# Patient Record
Sex: Male | Born: 1955 | Race: White | Hispanic: No | Marital: Married | Smoking: Never smoker
Health system: Southern US, Community
[De-identification: ages and names within clinical notes are randomized; demographics above are authoritative.]

## PROBLEM LIST (undated history)

## (undated) HISTORY — PX: AORTIC VALVE REPLACEMENT (AVR)/CORONARY ARTERY BYPASS GRAFTING (CABG): SHX5725

---

## 2003-12-11 DIAGNOSIS — I359 Nonrheumatic aortic valve disorder, unspecified: Secondary | ICD-10-CM | POA: Insufficient documentation

## 2004-02-16 DIAGNOSIS — M545 Low back pain: Secondary | ICD-10-CM | POA: Insufficient documentation

## 2012-05-07 DIAGNOSIS — Z7901 Long term (current) use of anticoagulants: Secondary | ICD-10-CM | POA: Insufficient documentation

## 2012-12-09 DIAGNOSIS — Z2821 Immunization not carried out because of patient refusal: Secondary | ICD-10-CM | POA: Insufficient documentation

## 2013-01-22 DIAGNOSIS — G8929 Other chronic pain: Secondary | ICD-10-CM | POA: Insufficient documentation

## 2014-10-14 DIAGNOSIS — H16139 Photokeratitis, unspecified eye: Secondary | ICD-10-CM | POA: Insufficient documentation

## 2015-08-16 DIAGNOSIS — H6980 Other specified disorders of Eustachian tube, unspecified ear: Secondary | ICD-10-CM | POA: Insufficient documentation

## 2016-01-09 ENCOUNTER — Emergency Department: Payer: BC Managed Care – PPO

## 2016-01-09 ENCOUNTER — Inpatient Hospital Stay
Admission: EM | Admit: 2016-01-09 | Discharge: 2016-01-14 | DRG: 694 | Disposition: A | Discharge status: Home / Self Care | Payer: BC Managed Care – PPO | Attending: Specialist | Admitting: Specialist

## 2016-01-09 DIAGNOSIS — R319 Hematuria, unspecified: Secondary | ICD-10-CM | POA: Diagnosis present

## 2016-01-09 DIAGNOSIS — R1030 Lower abdominal pain, unspecified: Secondary | ICD-10-CM

## 2016-01-09 DIAGNOSIS — Z87442 Personal history of urinary calculi: Secondary | ICD-10-CM

## 2016-01-09 DIAGNOSIS — Z952 Presence of prosthetic heart valve: Secondary | ICD-10-CM

## 2016-01-09 DIAGNOSIS — N132 Hydronephrosis with renal and ureteral calculous obstruction: Principal | ICD-10-CM | POA: Diagnosis present

## 2016-01-09 DIAGNOSIS — N179 Acute kidney failure, unspecified: Secondary | ICD-10-CM | POA: Diagnosis present

## 2016-01-09 DIAGNOSIS — F17290 Nicotine dependence, other tobacco product, uncomplicated: Secondary | ICD-10-CM | POA: Diagnosis present

## 2016-01-09 DIAGNOSIS — Z951 Presence of aortocoronary bypass graft: Secondary | ICD-10-CM

## 2016-01-09 DIAGNOSIS — R310 Gross hematuria: Secondary | ICD-10-CM | POA: Diagnosis present

## 2016-01-09 DIAGNOSIS — D72829 Elevated white blood cell count, unspecified: Secondary | ICD-10-CM | POA: Diagnosis present

## 2016-01-09 DIAGNOSIS — Z7982 Long term (current) use of aspirin: Secondary | ICD-10-CM

## 2016-01-09 DIAGNOSIS — Z7901 Long term (current) use of anticoagulants: Secondary | ICD-10-CM

## 2016-01-09 DIAGNOSIS — N3289 Other specified disorders of bladder: Secondary | ICD-10-CM | POA: Diagnosis present

## 2016-01-09 DIAGNOSIS — K5750 Diverticulosis of both small and large intestine without perforation or abscess without bleeding: Secondary | ICD-10-CM | POA: Diagnosis present

## 2016-01-09 LAB — CBC WITH DIFFERENTIAL/PLATELET
BASOS ABS: 0 10*3/uL (ref 0–0.1)
Basophils Relative: 0 %
Eosinophils Absolute: 0.1 10*3/uL (ref 0–0.7)
Eosinophils Relative: 1 %
HEMATOCRIT: 42.4 % (ref 40.0–52.0)
HEMOGLOBIN: 14.6 g/dL (ref 13.0–18.0)
LYMPHS ABS: 1.8 10*3/uL (ref 1.0–3.6)
LYMPHS PCT: 11 %
MCH: 32.8 pg (ref 26.0–34.0)
MCHC: 34.4 g/dL (ref 32.0–36.0)
MCV: 95.2 fL (ref 80.0–100.0)
Monocytes Absolute: 1.7 10*3/uL — ABNORMAL HIGH (ref 0.2–1.0)
Monocytes Relative: 10 %
NEUTROS ABS: 12.7 10*3/uL — AB (ref 1.4–6.5)
Neutrophils Relative %: 78 %
Platelets: 217 10*3/uL (ref 150–440)
RBC: 4.45 MIL/uL (ref 4.40–5.90)
RDW: 13.1 % (ref 11.5–14.5)
WBC: 16.3 10*3/uL — AB (ref 3.8–10.6)

## 2016-01-09 LAB — COMPREHENSIVE METABOLIC PANEL
ALK PHOS: 79 U/L (ref 38–126)
ALT: 17 U/L (ref 17–63)
AST: 35 U/L (ref 15–41)
Albumin: 4.5 g/dL (ref 3.5–5.0)
Anion gap: 10 (ref 5–15)
BILIRUBIN TOTAL: 0.5 mg/dL (ref 0.3–1.2)
BUN: 11 mg/dL (ref 6–20)
CALCIUM: 8.9 mg/dL (ref 8.9–10.3)
CHLORIDE: 103 mmol/L (ref 101–111)
CO2: 21 mmol/L — ABNORMAL LOW (ref 22–32)
CREATININE: 0.85 mg/dL (ref 0.61–1.24)
Glucose, Bld: 136 mg/dL — ABNORMAL HIGH (ref 65–99)
Potassium: 3.6 mmol/L (ref 3.5–5.1)
Sodium: 134 mmol/L — ABNORMAL LOW (ref 135–145)
TOTAL PROTEIN: 7.6 g/dL (ref 6.5–8.1)

## 2016-01-09 LAB — PROTIME-INR
INR: 2.97
Prothrombin Time: 31.5 seconds — ABNORMAL HIGH (ref 11.4–15.2)

## 2016-01-09 MED ORDER — HYDROMORPHONE HCL 1 MG/ML IJ SOLN
1.0000 mg | Freq: Once | INTRAMUSCULAR | Status: AC
  Administered 2016-01-09: 1 mg via INTRAVENOUS

## 2016-01-09 MED ORDER — HYDROMORPHONE HCL 1 MG/ML IJ SOLN
INTRAMUSCULAR | Status: AC
  Administered 2016-01-09: 1 mg via INTRAVENOUS
  Filled 2016-01-09: qty 1

## 2016-01-09 MED ORDER — IOPAMIDOL (ISOVUE-300) INJECTION 61%
125.0000 mL | Freq: Once | INTRAVENOUS | Status: AC | PRN
  Administered 2016-01-09: 125 mL via INTRAVENOUS

## 2016-01-09 MED ORDER — PROMETHAZINE HCL 25 MG/ML IJ SOLN
12.5000 mg | Freq: Once | INTRAMUSCULAR | Status: AC
  Administered 2016-01-09: 12.5 mg via INTRAVENOUS
  Filled 2016-01-09: qty 1

## 2016-01-09 MED ORDER — MORPHINE SULFATE (PF) 4 MG/ML IV SOLN
4.0000 mg | INTRAVENOUS | Status: DC | PRN
  Administered 2016-01-09 – 2016-01-10 (×4): 4 mg via INTRAVENOUS
  Filled 2016-01-09 (×4): qty 1

## 2016-01-09 MED ORDER — SODIUM CHLORIDE 0.9 % IV BOLUS (SEPSIS)
1000.0000 mL | Freq: Once | INTRAVENOUS | Status: AC
  Administered 2016-01-09: 1000 mL via INTRAVENOUS

## 2016-01-09 NOTE — ED Provider Notes (Signed)
[facility] Emergency Department Provider Note  ____________________________________________   First MD Initiated Contact with Patient 01/09/16 2313     (approximate)  I have reviewed the triage vital signs and the nursing notes.   HISTORY  Chief Complaint Coagulation Disorder   HPI Tim Wright is a 60 y.o. male with a history of kidney stones as well as an aortic valve replacement on Coumadin who is presenting to the emergency department with lower abdominal pain radiating to the right side of his back as well as hematuria since 5 PM today. He says the pain is sharp and severe. He says that he was having clots from his penis prior to arrival.he does not report any nausea or vomiting.   History reviewed. No pertinent past medical history.  There are no active problems to display for this patient.   No past surgical history on file.  Prior to Admission medications   Medication Sig Start Date End Date Taking? Authorizing Provider  HYDROcodone-acetaminophen (NORCO/VICODIN) 5-325 MG tablet Take 1 tablet by mouth every 6 (six) hours as needed for moderate pain. 12/15/15   Historical Provider, MD  warfarin (COUMADIN) 2 MG tablet Take 1-2 tablets by mouth as directed. Take 2 tablets (4 mg) on Monday and Friday. Take 1 tablet (2 mg) on Sunday, Tuesday, Thursday, and Saturday. 12/20/15   Historical Provider, MD    Allergies Patient has no allergy information on record.  No family history on file.  Social History Social History  Substance Use Topics  . Smoking status: Not on file  . Smokeless tobacco: Not on file  . Alcohol use Not on file    Review of Systems Constitutional: No fever/chills Eyes: No visual changes. ENT: No sore throat. Cardiovascular: Denies chest pain. Respiratory: Denies shortness of breath. Gastrointestinal:   No nausea, no vomiting.  No diarrhea.  No constipation. Genitourinary: as above Musculoskeletal: as above Skin:  Negative for rash. Neurological: Negative for headaches, focal weakness or numbness.  10-point ROS otherwise negative.  ____________________________________________   PHYSICAL EXAM:  VITAL SIGNS: ED Triage Vitals  Enc Vitals Group     BP --      Pulse Rate 01/09/16 2235 90     Resp 01/09/16 2235 16     Temp --      Temp src --      SpO2 01/09/16 2235 98 %     Weight 01/09/16 2236 190 lb (86.2 kg)     Height 01/09/16 2236 6' 1.5" (1.867 m)     Head Circumference --      Peak Flow --      Pain Score 01/09/16 2329 10     Pain Loc --      Pain Edu? --      Excl. in GC? --     Constitutional: Alert and oriented. Patient visibly uncomfortable. Yelling in pain. Eyes: Conjunctivae are normal. PERRL. EOMI. Head: Atraumatic. Nose: No congestion/rhinnorhea. Mouth/Throat: Mucous membranes are moist.   Neck: No stridor.   Cardiovascular: Normal rate, regular rhythm. Grossly normal heart sounds.   Respiratory: Normal respiratory effort.  No retractions. Lungs CTAB. Gastrointestinal: Soft with diffuse tenderness palpation especially the right lower quadrant. No distention.  No CVA tenderness. Genitourinary:  Urethral Foley in place with pink bloody urine draining. Initially fully was placed and 700 cc of urine rapidly returned. Musculoskeletal: No lower extremity tenderness nor edema.  No joint effusions. Neurologic:  Normal speech and language. No gross focal neurologic deficits are appreciated.  Skin:  Skin is warm, dry and intact. No rash noted. Psychiatric: Mood and affect are normal. Speech and behavior are normal.  ____________________________________________   LABS (all labs ordered are listed, but only abnormal results are displayed)  Labs Reviewed  PROTIME-INR - Abnormal; Notable for the following:       Result Value   Prothrombin Time 31.5 (*)    All other components within normal limits  CBC WITH DIFFERENTIAL/PLATELET - Abnormal; Notable for the following:    WBC  16.3 (*)    Neutro Abs 12.7 (*)    Monocytes Absolute 1.7 (*)    All other components within normal limits  COMPREHENSIVE METABOLIC PANEL - Abnormal; Notable for the following:    Sodium 134 (*)    CO2 21 (*)    Glucose, Bld 136 (*)    All other components within normal limits  URINALYSIS, COMPLETE (UACMP) WITH MICROSCOPIC   ____________________________________________  EKG   ____________________________________________  RADIOLOGY    CT HEMATURIA WORKUP (Final result)  Result time 01/10/16 01:23:08  Final result by Awilda Metroourtnay Bloomer, MD (01/10/16 01:23:08)           Narrative:   CLINICAL DATA: Gross hematuria, severe pelvic pain. On Coumadin.  EXAM: CT ABDOMEN AND PELVIS WITHOUT AND WITH CONTRAST  TECHNIQUE: Multidetector CT imaging of the abdomen and pelvis was performed following the standard protocol before and following the bolus administration of intravenous contrast.  CONTRAST: 125mL ISOVUE-300 IOPAMIDOL (ISOVUE-300) INJECTION 61%  COMPARISON: None.  FINDINGS: LOWER CHEST: Minimal atelectasis. Ectatic versus aneurysmal included aorta with heavily calcified aortic root versus bowel, status post median sternotomy. The heart is mildly enlarged. No pericardial effusions. Mild coronary artery calcifications.  HEPATOBILIARY: Liver is normal. Multiple subcentimeter gallstones without CT findings of acute cholecystitis.  PANCREAS: Normal.  SPLEEN: Normal.  ADRENALS/URINARY TRACT: Kidneys are orthotopic, demonstrating symmetric enhancement. Intrinsically dense RIGHT urinary collecting system. 4 mm RIGHT lower pole nephrolithiasis. Mild bilateral hydroureteronephrosis. No ureteral calculi. Delayed RIGHT nephrogram. Too small to characterize hypodensities kidneys bilaterally. No mass. Urinary bladder is very distended with dependent rounded density consistent with blood products. No enhancing masses within the ureter ears or urinary bladder.  Foley catheter and retaining bulb within the bladder which is very distended.  STOMACH/BOWEL: The stomach, small and large bowel are normal in course and caliber without inflammatory changes. Severe LEFT: Diverticulosis. Small air-filled duodenum diverticulum. Normal appendix.  VASCULAR/LYMPHATIC: Aortoiliac vessels are normal in course and caliber, moderate calcific atherosclerosis. No lymphadenopathy by CT size criteria.  REPRODUCTIVE: Normal.  OTHER: No intraperitoneal free fluid or free air.  MUSCULOSKELETAL: Nonacute. Small fat containing umbilical hernias. Developmentally unfused L5 spinous process. Grade 1 L5-S1 anterolisthesis, chronic RIGHT L5 pars interarticularis defect.  IMPRESSION: Dense RIGHT collecting system compatible with hemorrhage. Hematoma within distended urinary bladder, containing Foley catheter.  RIGHT renal dysfunction. Mild bilateral hydroureteronephrosis. 4 mm RIGHT lower pole nephrolithiasis.  Severe LEFT colon diverticulosis without diverticulitis.   Electronically Signed By: Awilda Metroourtnay Bloomer M.D. On: 01/10/2016 01:23          ____________________________________________   PROCEDURES  Procedure(s) performed:   Procedures  Critical Care performed:   ____________________________________________   INITIAL IMPRESSION / ASSESSMENT AND PLAN / ED COURSE  Pertinent labs & imaging results that were available during my care of the patient were reviewed by me and considered in my medical decision making (see chart for details).   Clinical Course   ----------------------------------------- 3:35 AM on 01/10/2016 -----------------------------------------  Patient with persistent abdominal pain. Patient at  this time has received 8 mg of morphine followed by 1 mg of Dilaudid followed by 4 mg of morphine. I discussed the case with Dr. Sherryl BartersBudzyn of urology.  He reviewed the patient's CAT scan and thinks that he may see a renal mass to the  right side which could be the bleeding site. I also discussed case with Dr. Sherlie BanMcClain of cardiology. He recommends holding the patient's Coumadin but not directly reversing because of the mechanical valve. The patient has been undergoing three-way bladder irrigation and the urine is still correlated red. Because of the patient's persistent pain as well as hematuria he will be admitted to the hospital. Signed out to Dr. Sheryle Haildiamond. Patient aware of the CAT scan results. We did discuss the possibility of a right-sided renal mass. He as well as his family and also understanding and will comply with admission.  Abdominal pain likely secondary to bladder spasm versus persistent clot in the bladder.   ____________________________________________   FINAL CLINICAL IMPRESSION(S) / ED DIAGNOSES  Final diagnoses:  Hematuria  Abdominal pain.    NEW MEDICATIONS STARTED DURING THIS VISIT:  New Prescriptions   No medications on file     Note:  This document was prepared using Dragon voice recognition software and may include unintentional dictation errors.    Myrna Blazeravid Matthew Schaevitz, MD 01/10/16 [phone]

## 2016-01-09 NOTE — ED Triage Notes (Signed)
Pt presents via ems from c/o 200 cc of Blood in toilet from penis. Pt on coumadin and said to have been bleeding for 5 hours

## 2016-01-09 NOTE — ED Notes (Signed)
Pt moved to new room. Pt was given a 2nd dose of morphine after verbal order from Dr. Roxan Hockeyobinson. Pt is moaning at this time but is screaming, "I ain't getting no relief."

## 2016-01-09 NOTE — ED Notes (Signed)
Dr. Pershing ProudSchaevitz notified of pt's complaint of pain. Dr. Pershing ProudSchaevitz is at pt's bedside at this time.

## 2016-01-09 NOTE — ED Notes (Signed)
Gave report to [employer] Resourceslivia Rn

## 2016-01-09 NOTE — ED Notes (Signed)
Allyison Pate inserted Foley Catheter 16 FR

## 2016-01-10 ENCOUNTER — Encounter: Payer: Self-pay | Admitting: Internal Medicine

## 2016-01-10 DIAGNOSIS — R319 Hematuria, unspecified: Secondary | ICD-10-CM | POA: Diagnosis present

## 2016-01-10 DIAGNOSIS — R1030 Lower abdominal pain, unspecified: Secondary | ICD-10-CM

## 2016-01-10 LAB — URINALYSIS, COMPLETE (UACMP) WITH MICROSCOPIC
Bacteria, UA: NONE SEEN
Specific Gravity, Urine: 1.015 (ref 1.005–1.030)
Squamous Epithelial / LPF: NONE SEEN

## 2016-01-10 LAB — TSH: TSH: 0.882 u[IU]/mL (ref 0.350–4.500)

## 2016-01-10 MED ORDER — OXYBUTYNIN CHLORIDE 5 MG PO TABS
5.0000 mg | ORAL_TABLET | Freq: Once | ORAL | Status: AC
  Administered 2016-01-10: 5 mg via ORAL
  Filled 2016-01-10: qty 1

## 2016-01-10 MED ORDER — SODIUM CHLORIDE 0.9 % IV SOLN
INTRAVENOUS | Status: DC
  Administered 2016-01-10 – 2016-01-14 (×13): via INTRAVENOUS

## 2016-01-10 MED ORDER — OXYBUTYNIN CHLORIDE 5 MG PO TABS: 5.0000 mg | ORAL_TABLET | Freq: Three times a day (TID) | ORAL | Status: DC

## 2016-01-10 MED ORDER — HYDROMORPHONE HCL 1 MG/ML IJ SOLN: 1.0000 mg | INTRAMUSCULAR | Status: DC | PRN

## 2016-01-10 MED ORDER — HYDROCODONE-ACETAMINOPHEN 5-325 MG PO TABS
1.0000 | ORAL_TABLET | Freq: Four times a day (QID) | ORAL | Status: DC | PRN
  Administered 2016-01-10 – 2016-01-11 (×4): 1 via ORAL
  Filled 2016-01-10 (×5): qty 1

## 2016-01-10 MED ORDER — HYDROMORPHONE HCL 1 MG/ML IJ SOLN
1.0000 mg | INTRAMUSCULAR | Status: DC | PRN
  Administered 2016-01-10: 11:00:00 1 mg via INTRAVENOUS
  Filled 2016-01-10 (×2): qty 1

## 2016-01-10 MED ORDER — BELLADONNA ALKALOIDS-OPIUM 16.2-60 MG RE SUPP
1.0000 | Freq: Once | RECTAL | Status: AC
  Administered 2016-01-10: 15:00:00 1 via RECTAL

## 2016-01-10 MED ORDER — DEXTROSE 5 % IV SOLN: 1.0000 g | Freq: Once | INTRAVENOUS | Status: DC

## 2016-01-10 MED ORDER — CEFTRIAXONE SODIUM-DEXTROSE 1-3.74 GM-% IV SOLR
1.0000 g | Freq: Once | INTRAVENOUS | Status: AC
  Administered 2016-01-10: 17:00:00 1 g via INTRAVENOUS
  Filled 2016-01-10: qty 50

## 2016-01-10 MED ORDER — MORPHINE SULFATE (PF) 4 MG/ML IV SOLN
4.0000 mg | INTRAVENOUS | Status: DC | PRN
  Administered 2016-01-10 – 2016-01-12 (×6): 4 mg via INTRAVENOUS
  Filled 2016-01-10 (×6): qty 1

## 2016-01-10 MED ORDER — ONDANSETRON HCL 4 MG/2ML IJ SOLN: 4.0000 mg | Freq: Four times a day (QID) | INTRAMUSCULAR | Status: DC | PRN

## 2016-01-10 MED ORDER — OXYBUTYNIN CHLORIDE 5 MG PO TABS
10.0000 mg | ORAL_TABLET | Freq: Two times a day (BID) | ORAL | Status: DC
  Administered 2016-01-10 – 2016-01-14 (×9): 10 mg via ORAL
  Filled 2016-01-10 (×9): qty 2

## 2016-01-10 MED ORDER — TAMSULOSIN HCL 0.4 MG PO CAPS
0.4000 mg | ORAL_CAPSULE | Freq: Every day | ORAL | Status: DC
  Administered 2016-01-10 – 2016-01-14 (×5): 0.4 mg via ORAL
  Filled 2016-01-10 (×5): qty 1

## 2016-01-10 MED ORDER — HYDROMORPHONE HCL 1 MG/ML IJ SOLN
1.0000 mg | INTRAMUSCULAR | Status: DC | PRN
  Administered 2016-01-10: 1 mg via INTRAVENOUS
  Filled 2016-01-10: qty 1

## 2016-01-10 MED ORDER — ALUM & MAG HYDROXIDE-SIMETH 200-200-20 MG/5ML PO SUSP
15.0000 mL | ORAL | Status: DC | PRN
Start: 2016-01-10 — End: 2016-01-14

## 2016-01-10 MED ORDER — HYDROMORPHONE HCL 1 MG/ML IJ SOLN
1.0000 mg | INTRAMUSCULAR | Status: DC | PRN
  Administered 2016-01-10 – 2016-01-12 (×11): 1 mg via INTRAVENOUS
  Filled 2016-01-10 (×11): qty 1

## 2016-01-10 MED ORDER — ACETAMINOPHEN 650 MG RE SUPP: 650.0000 mg | Freq: Four times a day (QID) | RECTAL | Status: DC | PRN

## 2016-01-10 MED ORDER — ACETAMINOPHEN 325 MG PO TABS
650.0000 mg | ORAL_TABLET | Freq: Four times a day (QID) | ORAL | Status: DC | PRN
  Administered 2016-01-11: 18:00:00 650 mg via ORAL
  Filled 2016-01-10: qty 2

## 2016-01-10 MED ORDER — DOCUSATE SODIUM 100 MG PO CAPS
100.0000 mg | ORAL_CAPSULE | Freq: Two times a day (BID) | ORAL | Status: DC
  Administered 2016-01-10 – 2016-01-14 (×8): 100 mg via ORAL
  Filled 2016-01-10 (×8): qty 1

## 2016-01-10 MED ORDER — ONDANSETRON HCL 4 MG PO TABS: 4.0000 mg | ORAL_TABLET | Freq: Four times a day (QID) | ORAL | Status: DC | PRN

## 2016-01-10 NOTE — ED Notes (Signed)
Dr. Pershing ProudSchaevitz in with pt. Pt's catheter is flowing good at this time. Output is dark pink in color. Pt appears to be much more comfortable. Nurse changed pt's bedding and placed pt in clean gown. Pt given warm blanket and pillow.

## 2016-01-10 NOTE — Progress Notes (Signed)
On call MD, Dr. Clint GuyHower, paged. Received report for Tim EarthlyNoel RN in ED that CBI has been clamped after 6 liters of NS but Dr. Sheryle Hailiamond who admitted patient's H&P stated to continue bladder irrigation. Verbal order to continue bladder irrigation at this time. Will closely monitor.

## 2016-01-10 NOTE — ED Notes (Signed)
Report call finished att

## 2016-01-10 NOTE — Progress Notes (Signed)
Dr. Cherlynn KaiserSainani notified patient with minimal relief after: -Morphine 4 mg at 0929am -Dilaudid 1mg  at 1030am -Vicodin 5-325mg  at 1116am   MD to place order for medications for possible bladder spasms/burning not currently relieved by pain medications. Will continue to closely monitor.

## 2016-01-10 NOTE — Progress Notes (Signed)
[employer] [facility] [place] [facility]   PATIENT NAME: Tim Wright    MR#:  [id number]  DATE OF BIRTH:  03-18-1955  SUBJECTIVE:   Pt. Here due to lower abdominal pain and hematuria.  Patient having significant lower abdominal pain and continues to have hematuria. Patient has a continuous bladder irrigation which is draining. Patient wife is at bedside.  REVIEW OF SYSTEMS:    Review of Systems  Constitutional: Negative for chills and fever.  HENT: Negative for congestion and tinnitus.   Eyes: Negative for blurred vision and double vision.  Respiratory: Negative for cough, shortness of breath and wheezing.   Cardiovascular: Negative for chest pain, orthopnea and PND.  Gastrointestinal: Positive for abdominal pain. Negative for diarrhea, nausea and vomiting.  Genitourinary: Positive for hematuria. Negative for dysuria.  Neurological: Negative for dizziness, sensory change and focal weakness.  All other systems reviewed and are negative.   Nutrition: NPO Tolerating Diet:No Tolerating PT: Await Eval.    DRUG ALLERGIES:  No Known Allergies  VITALS:  Blood pressure 132/78, pulse (!) 106, temperature 100.1 F (37.8 C), temperature source Oral, resp. rate 16, height 6' 1.5" (1.867 m), weight 86.2 kg (190 lb), SpO2 93 %.  PHYSICAL EXAMINATION:   Physical Exam  GENERAL:  60 y.o.-year-old patient lying in the bed in moderate distress.  EYES: Pupils equal, round, reactive to light and accommodation. No scleral icterus. Extraocular muscles intact.  HEENT: Head atraumatic, normocephalic. Oropharynx and nasopharynx clear.  NECK:  Supple, no jugular venous distention. No thyroid enlargement, no tenderness.  LUNGS: Normal breath sounds bilaterally, no wheezing, rales, rhonchi. No use of accessory muscles of respiration.  CARDIOVASCULAR: S1, S2 normal. No murmurs, rubs, or gallops.  ABDOMEN: Soft, non-distended, + tender diffusely No rebound, rigidity. Bowel sounds present.  No organomegaly or mass.  EXTREMITIES: No cyanosis, clubbing or edema b/l.    NEUROLOGIC: Cranial nerves II through XII are intact. No focal Motor or sensory deficits b/l.   PSYCHIATRIC: The patient is alert and oriented x 3.  SKIN: No obvious rash, lesion, or ulcer.    LABORATORY PANEL:   CBC  Recent Labs Lab 01/09/16 2234  WBC 16.3*  HGB 14.6  HCT 42.4  PLT 217   ------------------------------------------------------------------------------------------------------------------  Chemistries   Recent Labs Lab 01/09/16 2234  NA 134*  K 3.6  CL 103  CO2 21*  GLUCOSE 136*  BUN 11  CREATININE 0.85  CALCIUM 8.9  AST 35  ALT 17  ALKPHOS 79  BILITOT 0.5   ------------------------------------------------------------------------------------------------------------------  Cardiac Enzymes No results for input(s): TROPONINI in the last 168 hours. ------------------------------------------------------------------------------------------------------------------  RADIOLOGY:  Ct Hematuria Workup  Result Date: 01/10/2016 CLINICAL DATA:  Gross hematuria, severe pelvic pain.  On Coumadin. EXAM: CT ABDOMEN AND PELVIS WITHOUT AND WITH CONTRAST TECHNIQUE: Multidetector CT imaging of the abdomen and pelvis was performed following the standard protocol before and following the bolus administration of intravenous contrast. CONTRAST:  125mL ISOVUE-300 IOPAMIDOL (ISOVUE-300) INJECTION 61% COMPARISON:  None. FINDINGS: LOWER CHEST: Minimal atelectasis. Ectatic versus aneurysmal included aorta with heavily calcified aortic root versus bowel, status post median sternotomy. The heart is mildly enlarged. No pericardial effusions. Mild coronary artery calcifications. HEPATOBILIARY: Liver is normal. Multiple subcentimeter gallstones without CT findings of acute cholecystitis. PANCREAS: Normal. SPLEEN: Normal. ADRENALS/URINARY TRACT: Kidneys are orthotopic, demonstrating symmetric enhancement.  Intrinsically dense RIGHT urinary collecting system. 4 mm RIGHT lower pole nephrolithiasis. Mild bilateral hydroureteronephrosis. No ureteral calculi. Delayed RIGHT nephrogram. Too small to characterize hypodensities kidneys bilaterally.  No mass. Urinary bladder is very distended with dependent rounded density consistent with blood products. No enhancing masses within the ureter ears or urinary bladder. Foley catheter and retaining bulb within the bladder which is very distended. STOMACH/BOWEL: The stomach, small and large bowel are normal in course and caliber without inflammatory changes. Severe LEFT: Diverticulosis. Small air-filled duodenum diverticulum. Normal appendix. VASCULAR/LYMPHATIC: Aortoiliac vessels are normal in course and caliber, moderate calcific atherosclerosis. No lymphadenopathy by CT size criteria. REPRODUCTIVE: Normal. OTHER: No intraperitoneal free fluid or free air. MUSCULOSKELETAL: Nonacute. Small fat containing umbilical hernias. Developmentally unfused L5 spinous process. Grade 1 L5-S1 anterolisthesis, chronic RIGHT L5 pars interarticularis defect. IMPRESSION: Dense RIGHT collecting system compatible with hemorrhage. Hematoma within distended urinary bladder, containing Foley catheter. RIGHT renal dysfunction. Mild bilateral hydroureteronephrosis. 4 mm RIGHT lower pole nephrolithiasis. Severe LEFT colon diverticulosis without diverticulitis. Electronically Signed   By: Awilda Metroourtnay  Bloomer M.D.   On: 01/10/2016 01:23     ASSESSMENT AND PLAN:   60 yo male w/ hx of aortic valve replacement, presents to the hospital due to lower abdominal pain and hematuria and noted to have urinary retention with hydroureteronephrosis bilaterally.  1. Hematuria with bilateral hydroureteronephrosis-secondary to nephrolithiasis/possible bladder outlet obstruction. -Continue supportive care with CBI. Pain control with IV morphine, Dilaudid. -Await further urologic input but patient will likely need  intervention with cystoscopy and bladder clot removal.   - Hg. Stable and will monitor.   2. Hx of Aortic Valve replacement - on coumadin which is on hold due to # 1.  - will resume post intervention when o.k. With Urology.   3. Leukocytosis -stress mediated due to # 1.  - will monitor     All the records are reviewed and case discussed with Care Management/Social Worker. Management plans discussed with the patient, family and they are in agreement.  CODE STATUS: Full code  DVT Prophylaxis: Coumadin  TOTAL TIME TAKING CARE OF THIS PATIENT: 35 minutes.   POSSIBLE D/C IN 2-3 DAYS, DEPENDING ON CLINICAL CONDITION.   [name],VIVEK J M.D on 01/10/2016 at 2:29 PM  Between 7am to 6pm - Pager - [phone]  After 6pm go to [email or web address] - Social research officer, governmentpassword [email or web address]  [facility] [place] [facility]  Office  [phone]  CC: Primary care physician; Lupita DawnOREY,JOHN, MD

## 2016-01-10 NOTE — Consult Note (Signed)
Urology Consult  I have been asked to see the patient by Dr. Cherlynn KaiserSainani, for evaluation and management of gross hematuria/ clot retention.  Chief Complaint: blood in urine  History of Present Illness: Tim Wright is a 60 y.o. year old who is admitted overnight with gross hematuria and clot retention. He does have a personal history of a mechanical aortic valve on Coumadin chronically, INR therapeutic. Upon presentation, he began experiencing bloody urine yesterday and developed severe abdominal pain. He had associated nausea without vomiting. CT scan showed mild bilateral hydronephrosis as well as density within the right collecting system compatible with possible hemorrhage. There is a well formed large clot in the bladder.  He's had several catheters since his admission and continues to have severe lower abdominal pain/bladder spasms.  He does have a personal history of gross hematuria approximate one year ago. He never sought urology follow-up. He's not had an episode of bleeding since that time. This resolved spontaneously.  He does have a personal history of tobacco abuse, smoker 30 years ago and continues to smoke 1-2 cigars daily since then.  History reviewed. No pertinent past medical history.  Past Surgical History:  Procedure Laterality Date  . AORTIC VALVE REPLACEMENT (AVR)/CORONARY ARTERY BYPASS GRAFTING (CABG)      Home Medications:  Current Meds  Medication Sig  . aspirin EC 81 MG tablet Take 81 mg by mouth daily.  Marland Kitchen. HYDROcodone-acetaminophen (NORCO/VICODIN) 5-325 MG tablet Take 1 tablet by mouth every 6 (six) hours as needed for moderate pain.  . Multiple Vitamin (MULTIVITAMIN WITH MINERALS) TABS tablet Take 1 tablet by mouth daily.  Marland Kitchen. warfarin (COUMADIN) 2 MG tablet Take 1-2 tablets by mouth as directed. Take 2 tablets (4 mg) on Monday and Friday. Take 1 tablet (2 mg) on Sunday, Tuesday, Thursday, and Saturday.    Allergies: No Known Allergies  Family History    Problem Relation Age of Onset  . Diabetes Mellitus II Mother   . CAD Father     Social History:  reports that he has never smoked. He has never used smokeless tobacco. He reports that he does not drink alcohol or use drugs.  ROS: A complete review of systems was performed.  All systems are negative except for pertinent findings as noted.  Physical Exam:  Vital signs in last 24 hours: Temp:  [99 F (37.2 C)-100.1 F (37.8 C)] 100.1 F (37.8 C) (12/18 1217) Pulse Rate:  [90-110] 106 (12/18 1217) Resp:  [16-22] 16 (12/18 0639) BP: (132-172)/(78-100) 132/78 (12/18 1217) SpO2:  [93 %-100 %] 93 % (12/18 1217) Weight:  [190 lb (86.2 kg)] 190 lb (86.2 kg) (12/17 2236) Constitutional:  Alert and oriented, No acute distress HEENT: [place] AT, moist mucus membranes.  Trachea midline, no masses Cardiovascular: Regular rate and rhythm, no clubbing, cyanosis, or edema. Respiratory: Normal respiratory effort, lungs clear bilaterally GI: Abdomen is soft, nontender, nondistended, no abdominal masses GU: No CVA tenderness Skin: No rashes, bruises or suspicious lesions Lymph: No cervical or inguinal adenopathy Neurologic: Grossly intact, no focal deficits, moving all 4 extremities Psychiatric: Normal mood and affect   Laboratory Data:   Recent Labs  01/09/16 2234  WBC 16.3*  HGB 14.6  HCT 42.4    Recent Labs  01/09/16 2234  NA 134*  K 3.6  CL 103  CO2 21*  GLUCOSE 136*  BUN 11  CREATININE 0.85  CALCIUM 8.9    Recent Labs  01/09/16 2234  INR 2.97   No results  for input(s): LABURIN in the last 72 hours. No results found for this or any previous visit.   Radiologic Imaging: Ct Hematuria Workup  Result Date: 01/10/2016 CLINICAL DATA:  Gross hematuria, severe pelvic pain.  On Coumadin. EXAM: CT ABDOMEN AND PELVIS WITHOUT AND WITH CONTRAST TECHNIQUE: Multidetector CT imaging of the abdomen and pelvis was performed following the standard protocol before and following the bolus  administration of intravenous contrast. CONTRAST:  125mL ISOVUE-300 IOPAMIDOL (ISOVUE-300) INJECTION 61% COMPARISON:  None. FINDINGS: LOWER CHEST: Minimal atelectasis. Ectatic versus aneurysmal included aorta with heavily calcified aortic root versus bowel, status post median sternotomy. The heart is mildly enlarged. No pericardial effusions. Mild coronary artery calcifications. HEPATOBILIARY: Liver is normal. Multiple subcentimeter gallstones without CT findings of acute cholecystitis. PANCREAS: Normal. SPLEEN: Normal. ADRENALS/URINARY TRACT: Kidneys are orthotopic, demonstrating symmetric enhancement. Intrinsically dense RIGHT urinary collecting system. 4 mm RIGHT lower pole nephrolithiasis. Mild bilateral hydroureteronephrosis. No ureteral calculi. Delayed RIGHT nephrogram. Too small to characterize hypodensities kidneys bilaterally. No mass. Urinary bladder is very distended with dependent rounded density consistent with blood products. No enhancing masses within the ureter ears or urinary bladder. Foley catheter and retaining bulb within the bladder which is very distended. STOMACH/BOWEL: The stomach, small and large bowel are normal in course and caliber without inflammatory changes. Severe LEFT: Diverticulosis. Small air-filled duodenum diverticulum. Normal appendix. VASCULAR/LYMPHATIC: Aortoiliac vessels are normal in course and caliber, moderate calcific atherosclerosis. No lymphadenopathy by CT size criteria. REPRODUCTIVE: Normal. OTHER: No intraperitoneal free fluid or free air. MUSCULOSKELETAL: Nonacute. Small fat containing umbilical hernias. Developmentally unfused L5 spinous process. Grade 1 L5-S1 anterolisthesis, chronic RIGHT L5 pars interarticularis defect. IMPRESSION: Dense RIGHT collecting system compatible with hemorrhage. Hematoma within distended urinary bladder, containing Foley catheter. RIGHT renal dysfunction. Mild bilateral hydroureteronephrosis. 4 mm RIGHT lower pole nephrolithiasis.  Severe LEFT colon diverticulosis without diverticulitis. Electronically Signed   By: Awilda Metroourtnay  Bloomer M.D.   On: 01/10/2016 01:23   Procedure: 18 French three-way Foley catheter was attempted to be hand irrigated but unsuccessfully. This was subsequently removed. I then advanced a 24 [place] three-way hematuria catheter per urethra into the bladder. There was some tightness at the fossa navicularis but ultimately successful in passing the catheter with assistance of the rigid catheter guide. The balloon was inflated with 20 cc of sterile water.  At this point time, the patient's bladder was hand irrigated with approximately 3 L of sterile saline at the bedside. This revealed a copious amount of old clot but ultimately cleared. He is resumed on slow drip CBI. He tolerated the procedure fairly well although had some discomfort with the initial Foley catheter placement.  Assessment/ Plan:  60 year old male on chronic anticoagulation and therapeutic INR setting with clot retention of unknown etiology.  1) Clot retention- improved with hand irrigation of bedside. See above procedure.  Urine culture ordered today to rule out infection as cause. After urine culture obtained, we'll give 1 g of ceftriaxone as prophylaxis given multiple catheters and irrigation/GU manipulation today.  2) Gross hematuria- etiology unclear. He will need a repeat CT urogram as an outpatient in approximately 4-6 weeks to assess for resolution of debris within the right collecting system. He will also need outpatient cystoscopy to rule out bladder etiology.  Follow H&H.    3) Bladder spasms- B&O suppositories and ditropan 5 mg tid prn   01/10/2016, 4:37 PM  Vanna ScotlandAshley Aimee Heldman,  MD   I spent 55 min with this patient of which greater than 50% was spent in  counseling and coordination of care with the patient.

## 2016-01-10 NOTE — Progress Notes (Signed)
Dr. Cherlynn KaiserSainani notified patient has been seen by urology (Dr. Apolinar JunesBrandon) who changed foley catheter size and irrigated bladder; does not think patient needs surgical intervention at this time. Continue continuous bladder irrigation. Dr. Cherlynn KaiserSainani to place diet order.

## 2016-01-10 NOTE — ED Notes (Signed)
Report can't be called from 0650 to 0720

## 2016-01-10 NOTE — ED Notes (Signed)
Dr. Pershing ProudSchaevitz stating that pt need CBI. Charge nurse Nedra HaiLee notified. Nedra HaiLee, CN is in the process of getting urology cart.

## 2016-01-10 NOTE — H&P (Addendum)
Tim Wright is an 60 y.o. male.   Chief Complaint: Blood in urine HPI: The patient with past medical history significant for mechanical aortic valve replacement presents emergency department complaining of blood in his urine. The blood was accompanied by severe abdominal pain. The patient states that he has felt nauseous but has not actually vomited. He denies chest pain but states that his abdominal pain sometimes takes his breath away. CT scan showed ureteral distention consistent with hemorrhage. Also demonstrated severe left colonic diverticulosis without diverticulitis. The radiologist also verbally commented on what may be a mass in his right kidney as well. These findings in conjunction with continued hematuria and abdominal pain prompted the emergency department staff to call the hospitalist service for admission.  History reviewed. No pertinent past medical history.  Past Surgical History:  Procedure Laterality Date  . CORONARY ARTERY BYPASS GRAFT      Family History  Problem Relation Age of Onset  . Diabetes Mellitus II Mother   . CAD Father    Social History:  has no tobacco, alcohol, and drug history on file.  Allergies: No Known Allergies  Prior to Admission medications   Medication Sig Start Date End Date Taking? Authorizing Provider  aspirin EC 81 MG tablet Take 81 mg by mouth daily.   Yes Historical Provider, MD  HYDROcodone-acetaminophen (NORCO/VICODIN) 5-325 MG tablet Take 1 tablet by mouth every 6 (six) hours as needed for moderate pain. 12/15/15  Yes Historical Provider, MD  Multiple Vitamin (MULTIVITAMIN WITH MINERALS) TABS tablet Take 1 tablet by mouth daily.   Yes Historical Provider, MD  warfarin (COUMADIN) 2 MG tablet Take 1-2 tablets by mouth as directed. Take 2 tablets (4 mg) on Monday and Friday. Take 1 tablet (2 mg) on Sunday, Tuesday, Thursday, and Saturday. 12/20/15  Yes Historical Provider, MD     Results for orders placed or performed during the  hospital encounter of 01/09/16 (from the past 48 hour(s))  Protime-INR     Status: Abnormal   Collection Time: 01/09/16 10:34 PM  Result Value Ref Range   Prothrombin Time 31.5 (H) 11.4 - 15.2 seconds   INR 2.97   CBC with Differential     Status: Abnormal   Collection Time: 01/09/16 10:34 PM  Result Value Ref Range   WBC 16.3 (H) 3.8 - 10.6 K/uL   RBC 4.45 4.40 - 5.90 MIL/uL   Hemoglobin 14.6 13.0 - 18.0 g/dL   HCT 42.4 40.0 - 52.0 %   MCV 95.2 80.0 - 100.0 fL   MCH 32.8 26.0 - 34.0 pg   MCHC 34.4 32.0 - 36.0 g/dL   RDW 13.1 11.5 - 14.5 %   Platelets 217 150 - 440 K/uL   Neutrophils Relative % 78 %   Neutro Abs 12.7 (H) 1.4 - 6.5 K/uL   Lymphocytes Relative 11 %   Lymphs Abs 1.8 1.0 - 3.6 K/uL   Monocytes Relative 10 %   Monocytes Absolute 1.7 (H) 0.2 - 1.0 K/uL   Eosinophils Relative 1 %   Eosinophils Absolute 0.1 0 - 0.7 K/uL   Basophils Relative 0 %   Basophils Absolute 0.0 0 - 0.1 K/uL  Comprehensive metabolic panel     Status: Abnormal   Collection Time: 01/09/16 10:34 PM  Result Value Ref Range   Sodium 134 (L) 135 - 145 mmol/L   Potassium 3.6 3.5 - 5.1 mmol/L   Chloride 103 101 - 111 mmol/L   CO2 21 (L) 22 - 32 mmol/L  Glucose, Bld 136 (H) 65 - 99 mg/dL   BUN 11 6 - 20 mg/dL   Creatinine, Ser 5.51 0.61 - 1.24 mg/dL   Calcium 8.9 8.9 - 86.0 mg/dL   Total Protein 7.6 6.5 - 8.1 g/dL   Albumin 4.5 3.5 - 5.0 g/dL   AST 35 15 - 41 U/L   ALT 17 17 - 63 U/L   Alkaline Phosphatase 79 38 - 126 U/L   Total Bilirubin 0.5 0.3 - 1.2 mg/dL   GFR calc non Af Amer >60 >60 mL/min   GFR calc Af Amer >60 >60 mL/min    Comment: (NOTE) The eGFR has been calculated using the CKD EPI equation. This calculation has not been validated in all clinical situations. eGFR's persistently <60 mL/min signify possible Chronic Kidney Disease.    Anion gap 10 5 - 15   Ct Hematuria Workup  Result Date: 01/10/2016 CLINICAL DATA:  Gross hematuria, severe pelvic pain.  On Coumadin. EXAM: CT  ABDOMEN AND PELVIS WITHOUT AND WITH CONTRAST TECHNIQUE: Multidetector CT imaging of the abdomen and pelvis was performed following the standard protocol before and following the bolus administration of intravenous contrast. CONTRAST:  ISOVUE-300 IOPAMIDOL (ISOVUE-300) INJECTION 61% COMPARISON:  None. FINDINGS: LOWER CHEST: Minimal atelectasis. Ectatic versus aneurysmal included aorta with heavily calcified aortic root versus bowel, status post median sternotomy. The heart is mildly enlarged. No pericardial effusions. Mild coronary artery calcifications. HEPATOBILIARY: Liver is normal. Multiple subcentimeter gallstones without CT findings of acute cholecystitis. PANCREAS: Normal. SPLEEN: Normal. ADRENALS/URINARY TRACT: Kidneys are orthotopic, demonstrating symmetric enhancement. Intrinsically dense RIGHT urinary collecting system. 4 mm RIGHT lower pole nephrolithiasis. Mild bilateral hydroureteronephrosis. No ureteral calculi. Delayed RIGHT nephrogram. Too small to characterize hypodensities kidneys bilaterally. No mass. Urinary bladder is very distended with dependent rounded density consistent with blood products. No enhancing masses within the ureter ears or urinary bladder. Foley catheter and retaining bulb within the bladder which is very distended. STOMACH/BOWEL: The stomach, small and large bowel are normal in course and caliber without inflammatory changes. Severe LEFT: Diverticulosis. Small air-filled duodenum diverticulum. Normal appendix. VASCULAR/LYMPHATIC: Aortoiliac vessels are normal in course and caliber, moderate calcific atherosclerosis. No lymphadenopathy by CT size criteria. REPRODUCTIVE: Normal. OTHER: No intraperitoneal free fluid or free air. MUSCULOSKELETAL: Nonacute. Small fat containing umbilical hernias. Developmentally unfused L5 spinous process. Grade 1 L5-S1 anterolisthesis, chronic RIGHT L5 pars interarticularis defect. IMPRESSION: Dense RIGHT collecting system compatible with  hemorrhage. Hematoma within distended urinary bladder, containing Foley catheter. RIGHT renal dysfunction. Mild bilateral hydroureteronephrosis. 4 mm RIGHT lower pole nephrolithiasis. Severe LEFT colon diverticulosis without diverticulitis. Electronically Signed   By: Awilda Metro M.D.   On: 01/10/2016 01:23    Review of Systems  Constitutional: Negative for chills and fever.  HENT: Negative for sore throat and tinnitus.   Eyes: Negative for blurred vision and redness.  Respiratory: Negative for cough and shortness of breath.   Cardiovascular: Negative for chest pain, palpitations, orthopnea and PND.  Gastrointestinal: Positive for abdominal pain and nausea. Negative for diarrhea and vomiting.  Genitourinary: Positive for hematuria. Negative for dysuria, frequency and urgency.  Musculoskeletal: Negative for joint pain and myalgias.  Skin: Negative for rash.       No lesions  Neurological: Negative for speech change, focal weakness and weakness.  Endo/Heme/Allergies: Does not bruise/bleed easily.       No temperature intolerance  Psychiatric/Behavioral: Negative for depression and suicidal ideas.    Blood pressure (!) 155/88, pulse (!) 104, resp. rate 16, height  6' 1.5" (1.867 m), weight 86.2 kg (190 lb), SpO2 94 %. Physical Exam  Constitutional: He is oriented to person, place, and time. He appears well-developed and well-nourished. No distress.  HENT:  Head: Normocephalic and atraumatic.  Mouth/Throat: Oropharynx is clear and moist.  Eyes: Conjunctivae and EOM are normal. Pupils are equal, round, and reactive to light. No scleral icterus.  Neck: Normal range of motion. Neck supple. No JVD present. No tracheal deviation present. No thyromegaly present.  Cardiovascular: Normal rate, regular rhythm and normal heart sounds.  Exam reveals no gallop and no friction rub.   No murmur heard. Respiratory: Effort normal and breath sounds normal. No respiratory distress.  GI: Soft. Bowel  sounds are normal. He exhibits no distension and no mass. There is tenderness. There is guarding (voluntary). There is no rebound.  Genitourinary:  Genitourinary Comments: Deferred  Musculoskeletal: Normal range of motion. He exhibits no edema.  Lymphadenopathy:    He has no cervical adenopathy.  Neurological: He is alert and oriented to person, place, and time. No cranial nerve deficit.  Skin: Skin is warm and dry. No rash noted. No erythema.  Psychiatric: He has a normal mood and affect. His behavior is normal. Judgment and thought content normal.     Assessment/Plan This is a 60 year old male admitted for hematuria. 1. Hematuria: Three-way urethral catheter placed and irrigated. The patient still has light red urine. Urology has been consulted. Await further recommendations regarding renal imaging and possible biopsy. Hold oral anticoagulation. Continue bladder irrigation 2. Renal mass: possible source of hemorrhage and pain.  Manage pain 3. DVT prophylaxis: SCDs 4. GI prophylaxis: none The patient is a full code. Time spent on admission was inpatient care approximately 45 minutes  Harrie Foreman, MD 01/10/2016, 7:27 AM

## 2016-01-10 NOTE — ED Notes (Signed)
Pt's foley bag emptied. Pt is resting quietly at this time. Dr. Pershing ProudSchaevitz in to see pt and family. Pt's output is clear to light pink with no visible clots at this time.

## 2016-01-11 DIAGNOSIS — N132 Hydronephrosis with renal and ureteral calculous obstruction: Secondary | ICD-10-CM | POA: Diagnosis present

## 2016-01-11 DIAGNOSIS — R310 Gross hematuria: Secondary | ICD-10-CM | POA: Diagnosis present

## 2016-01-11 DIAGNOSIS — Z952 Presence of prosthetic heart valve: Secondary | ICD-10-CM | POA: Diagnosis not present

## 2016-01-11 DIAGNOSIS — Z87442 Personal history of urinary calculi: Secondary | ICD-10-CM | POA: Diagnosis not present

## 2016-01-11 DIAGNOSIS — K5750 Diverticulosis of both small and large intestine without perforation or abscess without bleeding: Secondary | ICD-10-CM | POA: Diagnosis present

## 2016-01-11 DIAGNOSIS — Z951 Presence of aortocoronary bypass graft: Secondary | ICD-10-CM | POA: Diagnosis not present

## 2016-01-11 DIAGNOSIS — Z7982 Long term (current) use of aspirin: Secondary | ICD-10-CM | POA: Diagnosis not present

## 2016-01-11 DIAGNOSIS — Z7901 Long term (current) use of anticoagulants: Secondary | ICD-10-CM | POA: Diagnosis not present

## 2016-01-11 DIAGNOSIS — F17290 Nicotine dependence, other tobacco product, uncomplicated: Secondary | ICD-10-CM | POA: Diagnosis present

## 2016-01-11 DIAGNOSIS — N3289 Other specified disorders of bladder: Secondary | ICD-10-CM | POA: Diagnosis present

## 2016-01-11 DIAGNOSIS — N179 Acute kidney failure, unspecified: Secondary | ICD-10-CM | POA: Diagnosis present

## 2016-01-11 DIAGNOSIS — D72829 Elevated white blood cell count, unspecified: Secondary | ICD-10-CM | POA: Diagnosis present

## 2016-01-11 DIAGNOSIS — R1030 Lower abdominal pain, unspecified: Secondary | ICD-10-CM | POA: Diagnosis present

## 2016-01-11 LAB — CBC
HCT: 35.7 % — ABNORMAL LOW (ref 40.0–52.0)
HEMOGLOBIN: 12.4 g/dL — AB (ref 13.0–18.0)
MCH: 33.6 pg (ref 26.0–34.0)
MCHC: 34.8 g/dL (ref 32.0–36.0)
MCV: 96.4 fL (ref 80.0–100.0)
PLATELETS: 151 10*3/uL (ref 150–440)
RBC: 3.7 MIL/uL — ABNORMAL LOW (ref 4.40–5.90)
RDW: 13.1 % (ref 11.5–14.5)
WBC: 20 10*3/uL — ABNORMAL HIGH (ref 3.8–10.6)

## 2016-01-11 LAB — BASIC METABOLIC PANEL
Anion gap: 8 (ref 5–15)
BUN: 15 mg/dL (ref 6–20)
CALCIUM: 8.1 mg/dL — AB (ref 8.9–10.3)
CHLORIDE: 103 mmol/L (ref 101–111)
CO2: 23 mmol/L (ref 22–32)
CREATININE: 1.37 mg/dL — AB (ref 0.61–1.24)
GFR, EST NON AFRICAN AMERICAN: 54 mL/min — AB (ref >60)
Glucose, Bld: 131 mg/dL — ABNORMAL HIGH (ref 65–99)
Potassium: 3.8 mmol/L (ref 3.5–5.1)
SODIUM: 134 mmol/L — AB (ref 135–145)

## 2016-01-11 LAB — HEMOGLOBIN A1C
Hgb A1c MFr Bld: 5.4 % (ref 4.8–5.6)
Mean Plasma Glucose: 108 mg/dL

## 2016-01-11 LAB — PROTIME-INR
INR: 1.6
PROTHROMBIN TIME: 19.2 s — AB (ref 11.4–15.2)

## 2016-01-11 LAB — URINE CULTURE: CULTURE: NO GROWTH

## 2016-01-11 LAB — APTT: aPTT: 49 seconds — ABNORMAL HIGH (ref 24–36)

## 2016-01-11 MED ORDER — HEPARIN (PORCINE) IN NACL 100-0.45 UNIT/ML-% IJ SOLN
1900.0000 [IU]/h | INTRAMUSCULAR | Status: DC
  Administered 2016-01-11: 18:00:00 1300 [IU]/h via INTRAVENOUS
  Administered 2016-01-12: 1500 [IU]/h via INTRAVENOUS
  Administered 2016-01-12: 23:00:00 1900 [IU]/h via INTRAVENOUS
  Filled 2016-01-11 (×4): qty 250

## 2016-01-11 MED ORDER — HYDROCODONE-ACETAMINOPHEN 5-325 MG PO TABS
2.0000 | ORAL_TABLET | ORAL | Status: DC | PRN
Start: 2016-01-11 — End: 2016-01-14
  Administered 2016-01-11 – 2016-01-14 (×10): 2 via ORAL
  Filled 2016-01-11 (×10): qty 2

## 2016-01-11 NOTE — Progress Notes (Signed)
[employer] - [place] at [facility]   PATIENT NAME: Tim Wright    MR#:  [id number]  DATE OF BIRTH:  09/26/1955  SUBJECTIVE:   Pt. Here due to lower abdominal pain and hematuria.  Hematuria has significantly improved. Patient continues to have a lower abdominal pain though.  REVIEW OF SYSTEMS:    Review of Systems  Constitutional: Negative for chills and fever.  HENT: Negative for congestion and tinnitus.   Eyes: Negative for blurred vision and double vision.  Respiratory: Negative for cough, shortness of breath and wheezing.   Cardiovascular: Negative for chest pain, orthopnea and PND.  Gastrointestinal: Positive for abdominal pain. Negative for diarrhea, nausea and vomiting.  Genitourinary: Positive for hematuria. Negative for dysuria.  Neurological: Negative for dizziness, sensory change and focal weakness.  All other systems reviewed and are negative.   Nutrition: Clear liquids Tolerating Diet: Yes Tolerating PT: Await Eval.    DRUG ALLERGIES:  No Known Allergies  VITALS:  Blood pressure (!) 162/91, pulse (!) 103, temperature 99.2 F (37.3 C), temperature source Oral, resp. rate 20, height 6' 1.5" (1.867 m), weight 93.9 kg (207 lb), SpO2 93 %.  PHYSICAL EXAMINATION:   Physical Exam  GENERAL:  60 y.o.-year-old patient lying in the bed in mild distress EYES: Pupils equal, round, reactive to light and accommodation. No scleral icterus. Extraocular muscles intact.  HEENT: Head atraumatic, normocephalic. Oropharynx and nasopharynx clear.  NECK:  Supple, no jugular venous distention. No thyroid enlargement, no tenderness.  LUNGS: Normal breath sounds bilaterally, no wheezing, rales, rhonchi. No use of accessory muscles of respiration.  CARDIOVASCULAR: S1, S2 normal. No murmurs, rubs, or gallops.  ABDOMEN: Soft, non-distended, + tender diffusely No rebound, rigidity. Bowel sounds present. No organomegaly or mass.  EXTREMITIES: No cyanosis, clubbing or  edema b/l.    NEUROLOGIC: Cranial nerves II through XII are intact. No focal Motor or sensory deficits b/l.   PSYCHIATRIC: The patient is alert and oriented x 3.  SKIN: No obvious rash, lesion, or ulcer.    LABORATORY PANEL:   CBC  Recent Labs Lab 01/11/16 1409  WBC 20.0*  HGB 12.4*  HCT 35.7*  PLT 151   ------------------------------------------------------------------------------------------------------------------  Chemistries   Recent Labs Lab 01/09/16 2234 01/11/16 1409  NA 134* 134*  K 3.6 3.8  CL 103 103  CO2 21* 23  GLUCOSE 136* 131*  BUN 11 15  CREATININE 0.85 1.37*  CALCIUM 8.9 8.1*  AST 35  --   ALT 17  --   ALKPHOS 79  --   BILITOT 0.5  --    ------------------------------------------------------------------------------------------------------------------  Cardiac Enzymes No results for input(s): TROPONINI in the last 168 hours. ------------------------------------------------------------------------------------------------------------------  RADIOLOGY:  Ct Hematuria Workup  Result Date: 01/10/2016 CLINICAL DATA:  Gross hematuria, severe pelvic pain.  On Coumadin. EXAM: CT ABDOMEN AND PELVIS WITHOUT AND WITH CONTRAST TECHNIQUE: Multidetector CT imaging of the abdomen and pelvis was performed following the standard protocol before and following the bolus administration of intravenous contrast. CONTRAST:  125mL ISOVUE-300 IOPAMIDOL (ISOVUE-300) INJECTION 61% COMPARISON:  None. FINDINGS: LOWER CHEST: Minimal atelectasis. Ectatic versus aneurysmal included aorta with heavily calcified aortic root versus bowel, status post median sternotomy. The heart is mildly enlarged. No pericardial effusions. Mild coronary artery calcifications. HEPATOBILIARY: Liver is normal. Multiple subcentimeter gallstones without CT findings of acute cholecystitis. PANCREAS: Normal. SPLEEN: Normal. ADRENALS/URINARY TRACT: Kidneys are orthotopic, demonstrating symmetric enhancement.  Intrinsically dense RIGHT urinary collecting system. 4 mm RIGHT lower pole nephrolithiasis. Mild bilateral hydroureteronephrosis. No  ureteral calculi. Delayed RIGHT nephrogram. Too small to characterize hypodensities kidneys bilaterally. No mass. Urinary bladder is very distended with dependent rounded density consistent with blood products. No enhancing masses within the ureter ears or urinary bladder. Foley catheter and retaining bulb within the bladder which is very distended. STOMACH/BOWEL: The stomach, small and large bowel are normal in course and caliber without inflammatory changes. Severe LEFT: Diverticulosis. Small air-filled duodenum diverticulum. Normal appendix. VASCULAR/LYMPHATIC: Aortoiliac vessels are normal in course and caliber, moderate calcific atherosclerosis. No lymphadenopathy by CT size criteria. REPRODUCTIVE: Normal. OTHER: No intraperitoneal free fluid or free air. MUSCULOSKELETAL: Nonacute. Small fat containing umbilical hernias. Developmentally unfused L5 spinous process. Grade 1 L5-S1 anterolisthesis, chronic RIGHT L5 pars interarticularis defect. IMPRESSION: Dense RIGHT collecting system compatible with hemorrhage. Hematoma within distended urinary bladder, containing Foley catheter. RIGHT renal dysfunction. Mild bilateral hydroureteronephrosis. 4 mm RIGHT lower pole nephrolithiasis. Severe LEFT colon diverticulosis without diverticulitis. Electronically Signed   By: Awilda Metroourtnay  Bloomer M.D.   On: 01/10/2016 01:23     ASSESSMENT AND PLAN:   60 yo male w/ hx of aortic valve replacement, presents to the hospital due to lower abdominal pain and hematuria and noted to have urinary retention with hydroureteronephrosis bilaterally.  1. Hematuria with bilateral hydroureteronephrosis-secondary to clot/hemorrage into the right sided ureter.   - Hg. Stable and hematuria has improved w/ CBI. Patient continues to have significant pain requiring IV narcotics. -Seen by urology and no plans  for surgical intervention presently but will need Repeat CT urogram as outpatient in 4-6 weeks.  - cont. Supportive care w/ pain control, CBI for now.     2. Hx of Aortic Valve replacement - pt. Was on coumadin as outpatient but on hold due to # 1.  - will start on Heparin gtt for now and watch for bleeding. Discussed w/ Dr. Apolinar JunesBrandon.   3. Leukocytosis -stress mediated due to # 1.  - will monitor   4. AKI - obstructive in nature due to the hydroureteronephrosis. -Continue IV fluids and follow BUN and creatinine.    All the records are reviewed and case discussed with Care Management/Social Worker. Management plans discussed with the patient, family and they are in agreement.  CODE STATUS: Full code  DVT Prophylaxis: Heparin gtt.   TOTAL TIME TAKING CARE OF THIS PATIENT: 30 minutes.   POSSIBLE D/C IN 2-3 DAYS, DEPENDING ON CLINICAL CONDITION.   [name],Takari Duncombe J M.D on 01/11/2016 at 3:00 PM  Between 7am to 6pm - Pager - [phone]  After 6pm go to [email or web address] - Social research officer, governmentpassword EPAS [email or web address]  [facility] [place] [facility]  Office  [phone]  CC: Primary care physician; Lupita DawnOREY,JOHN, MD

## 2016-01-11 NOTE — Plan of Care (Signed)
Problem: Pain Managment: Goal: General experience of comfort will improve Outcome: Progressing Pain control via PRN pain meds.     

## 2016-01-11 NOTE — Progress Notes (Signed)
Urology Consult Follow Up  Subjective: Patient complaining of right-sided flank pain this morning.  Given Dilaudid and pain was controlled.  CBI slow gtt.  Urine very light pink this am.  VSS low grade fever over night.  Urine culture is pending.    Anti-infectives: Anti-infectives    Start     Dose/Rate Route Frequency Ordered Stop   01/10/16 1500  cefTRIAXone (ROCEPHIN) IVPB 1 g     1 g 100 mL/hr over 30 Minutes Intravenous  Once 01/10/16 1446 01/10/16 1725   01/10/16 1445  cefTRIAXone (ROCEPHIN) 1 g in dextrose 5 % 50 mL IVPB  Status:  Discontinued     1 g 100 mL/hr over 30 Minutes Intravenous  Once 01/10/16 1442 01/10/16 1447      Current Facility-Administered Medications  Medication Dose Route Frequency Provider Last Rate Last Dose  . 0.9 %  sodium chloride infusion   Intravenous Continuous Arnaldo NatalMichael S Diamond, MD 150 mL/hr at 01/11/16 0221    . acetaminophen (TYLENOL) tablet 650 mg  650 mg Oral Q6H PRN Arnaldo NatalMichael S Diamond, MD       Or  . acetaminophen (TYLENOL) suppository 650 mg  650 mg Rectal Q6H PRN Arnaldo NatalMichael S Diamond, MD      . alum & mag hydroxide-simeth (MAALOX/MYLANTA) 200-200-20 MG/5ML suspension 15 mL  15 mL Oral Q4H PRN [place] SirenVivek J Sainani, MD      . docusate sodium (COLACE) capsule 100 mg  100 mg Oral BID Arnaldo NatalMichael S Diamond, MD   100 mg at 01/10/16 2107  . HYDROcodone-acetaminophen (NORCO/VICODIN) 5-325 MG per tablet 1 tablet  1 tablet Oral Q6H PRN Arnaldo NatalMichael S Diamond, MD   1 tablet at 01/11/16 0531  . HYDROmorphone (DILAUDID) injection 1 mg  1 mg Intravenous Q3H PRN [place] SirenVivek J Sainani, MD   1 mg at 01/11/16 0647  . morphine 4 MG/ML injection 4 mg  4 mg Intravenous Q4H PRN Arnaldo NatalMichael S Diamond, MD   4 mg at 01/10/16 2348  . ondansetron (ZOFRAN) tablet 4 mg  4 mg Oral Q6H PRN Arnaldo NatalMichael S Diamond, MD       Or  . ondansetron [facility](ZOFRAN) injection 4 mg  4 mg Intravenous Q6H PRN Arnaldo NatalMichael S Diamond, MD      . oxybutynin [facility](DITROPAN) tablet 10 mg  10 mg Oral BID [place] SirenVivek J Sainani, MD   10 mg at 01/10/16  2107  . tamsulosin (FLOMAX) capsule 0.4 mg  0.4 mg Oral Daily [place] SirenVivek J Sainani, MD   0.4 mg at 01/10/16 1156     Objective: Vital signs in last 24 hours: Temp:  [99 F (37.2 C)-100.4 F (38 C)] 99.8 F (37.7 C) (12/19 0650) Pulse Rate:  [96-110] 101 (12/19 0451) Resp:  [19-20] 19 (12/19 0451) BP: (115-164)/(59-86) 115/59 (12/19 0451) SpO2:  [92 %-100 %] 92 % (12/19 0451) Weight:  [207 lb (93.9 kg)] 207 lb (93.9 kg) (12/19 0500)  Intake/Output from previous day: 12/18 0701 - 12/19 0700 In: [id number] [P.O.:360; I.V.:2760; IV Piggyback:100] Out: 14100 [Urine:14100] Intake/Output this shift: No intake/output data recorded.   Physical Exam Constitutional: Well nourished. Alert and oriented, No acute distress. HEENT:  AT, moist mucus membranes. Trachea midline, no masses. Cardiovascular: No clubbing, cyanosis, or edema. Respiratory: Normal respiratory effort, no increased work of breathing. GI: Abdomen is soft, non tender, non distended, no abdominal masses. Liver and spleen not palpable.  No hernias appreciated.  Stool sample for occult testing is not indicated.   GU: No CVA tenderness.  No bladder fullness  or masses.  Patient with circumcised phallus.  Urethral meatus is patent.  No penile discharge. No penile lesions or rashes. Scrotum without lesions, cysts, rashes and/or edema.  Testicles are located scrotally bilaterally. No masses are appreciated in the testicles. Left and right epididymis are normal.  Three-way Foley in place draining clear urine on slow gtt.  Flushed with 50 cc of saline and no clots returned Rectal: Deferred.   Skin: No rashes, bruises or suspicious lesions. Lymph: No cervical or inguinal adenopathy. Neurologic: Grossly intact, no focal deficits, moving all 4 extremities. Psychiatric: Normal mood and affect.  Lab Results:   Recent Labs  01/09/16 2234  WBC 16.3*  HGB 14.6  HCT 42.4  PLT 217   BMET  Recent Labs  01/09/16 2234  NA 134*  K 3.6   CL 103  CO2 21*  GLUCOSE 136*  BUN 11  CREATININE 0.85  CALCIUM 8.9   PT/INR  Recent Labs  01/09/16 2234  LABPROT 31.5*  INR 2.97   ABG No results for input(s): PHART, HCO3 in the last 72 hours.  Invalid input(s): PCO2, PO2  Studies/Results: Ct Hematuria Workup  Result Date: 01/10/2016 CLINICAL DATA:  Gross hematuria, severe pelvic pain.  On Coumadin. EXAM: CT ABDOMEN AND PELVIS WITHOUT AND WITH CONTRAST TECHNIQUE: Multidetector CT imaging of the abdomen and pelvis was performed following the standard protocol before and following the bolus administration of intravenous contrast. CONTRAST:  125mL ISOVUE-300 IOPAMIDOL (ISOVUE-300) INJECTION 61% COMPARISON:  None. FINDINGS: LOWER CHEST: Minimal atelectasis. Ectatic versus aneurysmal included aorta with heavily calcified aortic root versus bowel, status post median sternotomy. The heart is mildly enlarged. No pericardial effusions. Mild coronary artery calcifications. HEPATOBILIARY: Liver is normal. Multiple subcentimeter gallstones without CT findings of acute cholecystitis. PANCREAS: Normal. SPLEEN: Normal. ADRENALS/URINARY TRACT: Kidneys are orthotopic, demonstrating symmetric enhancement. Intrinsically dense RIGHT urinary collecting system. 4 mm RIGHT lower pole nephrolithiasis. Mild bilateral hydroureteronephrosis. No ureteral calculi. Delayed RIGHT nephrogram. Too small to characterize hypodensities kidneys bilaterally. No mass. Urinary bladder is very distended with dependent rounded density consistent with blood products. No enhancing masses within the ureter ears or urinary bladder. Foley catheter and retaining bulb within the bladder which is very distended. STOMACH/BOWEL: The stomach, small and large bowel are normal in course and caliber without inflammatory changes. Severe LEFT: Diverticulosis. Small air-filled duodenum diverticulum. Normal appendix. VASCULAR/LYMPHATIC: Aortoiliac vessels are normal in course and caliber, moderate  calcific atherosclerosis. No lymphadenopathy by CT size criteria. REPRODUCTIVE: Normal. OTHER: No intraperitoneal free fluid or free air. MUSCULOSKELETAL: Nonacute. Small fat containing umbilical hernias. Developmentally unfused L5 spinous process. Grade 1 L5-S1 anterolisthesis, chronic RIGHT L5 pars interarticularis defect. IMPRESSION: Dense RIGHT collecting system compatible with hemorrhage. Hematoma within distended urinary bladder, containing Foley catheter. RIGHT renal dysfunction. Mild bilateral hydroureteronephrosis. 4 mm RIGHT lower pole nephrolithiasis. Severe LEFT colon diverticulosis without diverticulitis. Electronically Signed   By: Awilda Metroourtnay  Bloomer M.D.   On: 01/10/2016 01:23     Assessment: Clot retention- CBI turned off, if urine remains clear will discontinue the Foley this afternoon  Gross hematuria- Repeat CT Urogram to be repeated in 4-6 weeks as outpatient with cytoscopy  Right flank pain - Likely due to passage of clot and debris Dilaudid, Morphine and Norco on orders for pain control  Bladder spasms- B&O suppositories and ditropan 5 mg tid prn     LOS: 0 days    [facility] 01/11/2016

## 2016-01-11 NOTE — Progress Notes (Signed)
ANTICOAGULATION CONSULT NOTE - Initial Consult  Pharmacy Consult for Heparin Indication: AVR  No Known Allergies  Patient Measurements: Height: 6' 1.5" (186.7 cm) Weight: 207 lb (93.9 kg) IBW/kg (Calculated) : 81.05  Vital Signs: Temp: 99.2 F (37.3 C) (12/19 1232) Temp Source: Oral (12/19 1232) BP: 146/72 (12/19 1400) Pulse Rate: 100 (12/19 1400)  Labs:  Recent Labs  01/09/16 2234 01/11/16 1409  HGB 14.6 12.4*  HCT 42.4 35.7*  PLT 217 151  LABPROT 31.5*  --   INR 2.97  --   CREATININE 0.85 1.37*    Estimated Creatinine Clearance: 64.1 mL/min (by C-G formula based on SCr of 1.37 mg/dL (H)).   Medical History: History reviewed. No pertinent past medical history.  Medications:  Prescriptions Prior to Admission  Medication Sig Dispense Refill Last Dose  . aspirin EC 81 MG tablet Take 81 mg by mouth daily.   01/09/2016 at Unknown time  . HYDROcodone-acetaminophen (NORCO/VICODIN) 5-325 MG tablet Take 1 tablet by mouth every 6 (six) hours as needed for moderate pain.   prn at prn  . Multiple Vitamin (MULTIVITAMIN WITH MINERALS) TABS tablet Take 1 tablet by mouth daily.   Past Week at Unknown time  . warfarin (COUMADIN) 2 MG tablet Take 1-2 tablets by mouth as directed. Take 2 tablets (4 mg) on Monday and Friday. Take 1 tablet (2 mg) on Sunday, Tuesday, Thursday, and Saturday.  1 Past Week at Unknown time    Assessment: 60 y/o M with a h/o AVR on warfarin PTA to transition to heparin drip in case patient needs urology intervention.   Goal of Therapy:  Heparin level 0.3-0.7 units/ml Monitor platelets by anticoagulation protocol: Yes   Plan:  Will not bolus patient due to hematuria and INR 1.6. Would not recommend bolus for dosage adjustments unless necessary.  Start heparin infusion at 1300 units/hr Check anti-Xa level in 6 hours and daily while on heparin Continue to monitor H&H and platelets  Luisa HartChristy, Jarin Cornfield D 01/11/2016,4:22 PM

## 2016-01-11 NOTE — Progress Notes (Addendum)
Continuous bladder irrigation stopped this morning by Michiel CowboyShannon McGowan PA. Patient's urine now red. Continued c/o right back pain wrapping around lower abdomin. Temporarily relieved by PRN pain medications.   [facility].  Dr. Apolinar JunesBrandon paged, waiting for response.   Update 1300: continuous bladder irrigation restarted per Dr. Apolinar JunesBrandon. Patient and wife updated on current plan.

## 2016-01-12 LAB — BASIC METABOLIC PANEL
ANION GAP: 7 (ref 5–15)
BUN: 14 mg/dL (ref 6–20)
CHLORIDE: 104 mmol/L (ref 101–111)
CO2: 24 mmol/L (ref 22–32)
Calcium: 8.7 mg/dL — ABNORMAL LOW (ref 8.9–10.3)
Creatinine, Ser: 1.18 mg/dL (ref 0.61–1.24)
GFR calc Af Amer: >60 mL/min (ref >60)
GFR calc non Af Amer: >60 mL/min (ref >60)
GLUCOSE: 123 mg/dL — AB (ref 65–99)
Potassium: 3.9 mmol/L (ref 3.5–5.1)
Sodium: 135 mmol/L (ref 135–145)

## 2016-01-12 LAB — CBC
HCT: 36 % — ABNORMAL LOW (ref 40.0–52.0)
HEMOGLOBIN: 12.3 g/dL — AB (ref 13.0–18.0)
MCH: 33 pg (ref 26.0–34.0)
MCHC: 34.3 g/dL (ref 32.0–36.0)
MCV: 96.3 fL (ref 80.0–100.0)
Platelets: 151 10*3/uL (ref 150–440)
RBC: 3.74 MIL/uL — AB (ref 4.40–5.90)
RDW: 13 % (ref 11.5–14.5)
WBC: 20.1 10*3/uL — ABNORMAL HIGH (ref 3.8–10.6)

## 2016-01-12 LAB — PROTIME-INR
INR: 1.56
PROTHROMBIN TIME: 18.8 s — AB (ref 11.4–15.2)

## 2016-01-12 LAB — HEPARIN LEVEL (UNFRACTIONATED)
Heparin Unfractionated: 0.17 IU/mL — ABNORMAL LOW (ref 0.30–0.70)
Heparin Unfractionated: 0.25 IU/mL — ABNORMAL LOW (ref 0.30–0.70)
Heparin Unfractionated: 0.25 IU/mL — ABNORMAL LOW (ref 0.30–0.70)

## 2016-01-12 MED ORDER — HEPARIN BOLUS VIA INFUSION
2000.0000 [IU] | Freq: Once | INTRAVENOUS | Status: AC
  Administered 2016-01-12: 2000 [IU] via INTRAVENOUS
  Filled 2016-01-12: qty 2000

## 2016-01-12 MED ORDER — SENNA 8.6 MG PO TABS
1.0000 | ORAL_TABLET | Freq: Two times a day (BID) | ORAL | Status: DC
  Administered 2016-01-12 – 2016-01-14 (×4): 8.6 mg via ORAL
  Filled 2016-01-12 (×4): qty 1

## 2016-01-12 MED ORDER — BELLADONNA ALKALOIDS-OPIUM 16.2-60 MG RE SUPP: 1.0000 | Freq: Three times a day (TID) | RECTAL | Status: DC | PRN

## 2016-01-12 NOTE — Progress Notes (Signed)
[employer] - [place] [facility]   PATIENT NAME: Tim Wright    MR#:  [id number]  DATE OF BIRTH:  01-Dec-1955  SUBJECTIVE:   Pt. Here due to lower abdominal pain and hematuria.  Still having some Hematuria and CBI continues. Still having significant right flank pain.   REVIEW OF SYSTEMS:    Review of Systems  Constitutional: Negative for chills and fever.  HENT: Negative for congestion and tinnitus.   Eyes: Negative for blurred vision and double vision.  Respiratory: Negative for cough, shortness of breath and wheezing.   Cardiovascular: Negative for chest pain, orthopnea and PND.  Gastrointestinal: Positive for abdominal pain. Negative for diarrhea, nausea and vomiting.  Genitourinary: Positive for hematuria. Negative for dysuria.  Neurological: Negative for dizziness, sensory change and focal weakness.  All other systems reviewed and are negative.   Nutrition: Clear liquids Tolerating Diet: Yes Tolerating PT: Await Eval.    DRUG ALLERGIES:  No Known Allergies  VITALS:  Blood pressure (!) 169/89, pulse (!) 108, temperature 99.4 F (37.4 C), temperature source Oral, resp. rate (!) 22, height 6' 1.5" (1.867 m), weight 91.6 kg (202 lb), SpO2 91 %.  PHYSICAL EXAMINATION:   Physical Exam  GENERAL:  60 y.o.-year-old patient lying in the bed lethargic but in NAD EYES: Pupils equal, round, reactive to light and accommodation. No scleral icterus. Extraocular muscles intact.  HEENT: Head atraumatic, normocephalic. Oropharynx and nasopharynx clear.  NECK:  Supple, no jugular venous distention. No thyroid enlargement, no tenderness.  LUNGS: Normal breath sounds bilaterally, no wheezing, rales, rhonchi. No use of accessory muscles of respiration.  CARDIOVASCULAR: S1, S2 normal. No murmurs, rubs, or gallops.  ABDOMEN: Soft, non-distended, + tender in right flank No rebound, rigidity. Bowel sounds present. No organomegaly or mass.  EXTREMITIES: No cyanosis,  clubbing or edema b/l.    NEUROLOGIC: Cranial nerves II through XII are intact. No focal Motor or sensory deficits b/l.   PSYCHIATRIC: The patient is alert and oriented x 3.  SKIN: No obvious rash, lesion, or ulcer.    LABORATORY PANEL:   CBC  Recent Labs Lab 01/12/16 0744  WBC 20.1*  HGB 12.3*  HCT 36.0*  PLT 151   ------------------------------------------------------------------------------------------------------------------  Chemistries   Recent Labs Lab 01/09/16 2234  01/12/16 0744  NA 134*  < > 135  K 3.6  < > 3.9  CL 103  < > 104  CO2 21*  < > 24  GLUCOSE 136*  < > 123*  BUN 11  < > 14  CREATININE 0.85  < > 1.18  CALCIUM 8.9  < > 8.7*  AST 35  --   --   ALT 17  --   --   ALKPHOS 79  --   --   BILITOT 0.5  --   --   < > = values in this interval not displayed. ------------------------------------------------------------------------------------------------------------------  Cardiac Enzymes No results for input(s): TROPONINI in the last 168 hours. ------------------------------------------------------------------------------------------------------------------  RADIOLOGY:  No results found.   ASSESSMENT AND PLAN:   60 yo male w/ hx of aortic valve replacement, presents to the hospital due to lower abdominal pain and hematuria and noted to have urinary retention with hydroureteronephrosis bilaterally.  1. Hematuria with bilateral hydroureteronephrosis-secondary to clot/hemorrage into the right sided ureter.   - Hg. Stable but continues to have hematuria.  Cont. CBI.  -Seen by urology and no plans for surgical intervention presently but will need Repeat CT urogram as outpatient in 4-6 weeks.  -  cont. Supportive care w/ pain control, CBI for now.   - etiology of hematuria unclear ?? Mass (vs) Nephrolithiasis.  ?? Repeat imaging.    2. Hx of Aortic Valve replacement - pt. Was on coumadin as outpatient but on hold due to # 1.  - cont. Heparin gtt.   3.  Leukocytosis -stress mediated due to # 1.  - will monitor   4. AKI - obstructive in nature due to the hydroureteronephrosis. - improved w/ IV fluids.     All the records are reviewed and case discussed with Care Management/Social Worker. Management plans discussed with the patient, family and they are in agreement.  CODE STATUS: Full code  DVT Prophylaxis: Heparin gtt.   TOTAL TIME TAKING CARE OF THIS PATIENT: 30 minutes.   POSSIBLE D/C IN 2-3 DAYS, DEPENDING ON CLINICAL CONDITION.   [name],Paquita Printy J M.D on 01/12/2016 at 2:46 PM  Between 7am to 6pm - Pager - [phone]  After 6pm go to [email or web address] - Social research officer, governmentpassword EPAS [facility]  [employer]  [facility]  Office  [phone]  CC: Primary care physician; Lupita DawnOREY,JOHN, MD

## 2016-01-12 NOTE — Progress Notes (Signed)
ANTICOAGULATION CONSULT NOTE - Initial Consult  Pharmacy Consult for Heparin Indication: AVR  No Known Allergies  Patient Measurements: Height: 6' 1.5" (186.7 cm) Weight: 202 lb (91.6 kg) IBW/kg (Calculated) : 81.05  Vital Signs: Temp: 99.4 F (37.4 C) (12/20 0830) Temp Source: Oral (12/20 0830) BP: 169/89 (12/20 0830) Pulse Rate: 108 (12/20 0830)  Labs:  Recent Labs  01/09/16 2234 01/11/16 1409 01/11/16 1534 01/11/16 2355 01/12/16 0744 01/12/16 1452  HGB 14.6 12.4*  --   --  12.3*  --   HCT 42.4 35.7*  --   --  36.0*  --   PLT 217 151  --   --  151  --   APTT  --   --  49*  --   --   --   LABPROT 31.5*  --  19.2*  --  18.8*  --   INR 2.97  --  1.60  --  1.56  --   HEPARINUNFRC  --   --   --  0.17* 0.25* 0.25*  CREATININE 0.85 1.37*  --   --  1.18  --     Estimated Creatinine Clearance: 74.5 mL/min (by C-G formula based on SCr of 1.18 mg/dL).   Medical History: History reviewed. No pertinent past medical history.  Medications:  Prescriptions Prior to Admission  Medication Sig Dispense Refill Last Dose  . aspirin EC 81 MG tablet Take 81 mg by mouth daily.   01/09/2016 at Unknown time  . HYDROcodone-acetaminophen (NORCO/VICODIN) 5-325 MG tablet Take 1 tablet by mouth every 6 (six) hours as needed for moderate pain.   prn at prn  . Multiple Vitamin (MULTIVITAMIN WITH MINERALS) TABS tablet Take 1 tablet by mouth daily.   Past Week at Unknown time  . warfarin (COUMADIN) 2 MG tablet Take 1-2 tablets by mouth as directed. Take 2 tablets (4 mg) on Monday and Friday. Take 1 tablet (2 mg) on Sunday, Tuesday, Thursday, and Saturday.  1 Past Week at Unknown time    Assessment: 60 y/o M with a h/o AVR on warfarin PTA to transition to heparin drip in case patient needs urology intervention.   Goal of Therapy:  Heparin level 0.3-0.7 units/ml Monitor platelets by anticoagulation protocol: Yes   Plan:  Will not bolus patient due to hematuria and INR 1.6. Would not  recommend bolus for dosage adjustments unless necessary.  Start heparin infusion at 1300 units/hr Check anti-Xa level in 6 hours and daily while on heparin Continue to monitor H&H and platelets   12/19 2355 HL subtherapeutic. Will give reduced bolus (2000 units IV x 1) and increase rate to 1500 units/hr. Recheck HL in 6 hours.  12/20 0744 HL: subtherapeutic at 0.25. Will increase rate to 1700 units/hr. Recheck HL in 6 hours.   12/20  0450 HL subtherapeutic at 0.25. Spoke with nurse who confirmed drip has been running at 1700 units/ hr for last 6 hours and there have been no interruptions in therapy. Will increase heparin infusion to 1900units/hr. Recheck HL in 6 hours.   Donel Osowski M Om Lizotte, Pharm.D., BCPS Clinical Pharmacist 01/12/2016,3:37 PM

## 2016-01-12 NOTE — Progress Notes (Addendum)
ANTICOAGULATION CONSULT NOTE - Initial Consult  Pharmacy Consult for Heparin Indication: AVR  No Known Allergies  Patient Measurements: Height: 6' 1.5" (186.7 cm) Weight: 202 lb (91.6 kg) IBW/kg (Calculated) : 81.05  Vital Signs: Temp: 99.4 F (37.4 C) (12/20 0830) Temp Source: Oral (12/20 0830) BP: 169/89 (12/20 0830) Pulse Rate: 108 (12/20 0830)  Labs:  Recent Labs  01/09/16 2234 01/11/16 1409 01/11/16 1534 01/11/16 2355 01/12/16 0744  HGB 14.6 12.4*  --   --  12.3*  HCT 42.4 35.7*  --   --  36.0*  PLT 217 151  --   --  151  APTT  --   --  49*  --   --   LABPROT 31.5*  --  19.2*  --  18.8*  INR 2.97  --  1.60  --  1.56  HEPARINUNFRC  --   --   --  0.17* 0.25*  CREATININE 0.85 1.37*  --   --  1.18    Estimated Creatinine Clearance: 74.5 mL/min (by C-G formula based on SCr of 1.18 mg/dL).   Medical History: History reviewed. No pertinent past medical history.  Medications:  Prescriptions Prior to Admission  Medication Sig Dispense Refill Last Dose  . aspirin EC 81 MG tablet Take 81 mg by mouth daily.   01/09/2016 at Unknown time  . HYDROcodone-acetaminophen (NORCO/VICODIN) 5-325 MG tablet Take 1 tablet by mouth every 6 (six) hours as needed for moderate pain.   prn at prn  . Multiple Vitamin (MULTIVITAMIN WITH MINERALS) TABS tablet Take 1 tablet by mouth daily.   Past Week at Unknown time  . warfarin (COUMADIN) 2 MG tablet Take 1-2 tablets by mouth as directed. Take 2 tablets (4 mg) on Monday and Friday. Take 1 tablet (2 mg) on Sunday, Tuesday, Thursday, and Saturday.  1 Past Week at Unknown time    Assessment: 60 y/o M with a h/o AVR on warfarin PTA to transition to heparin drip in case patient needs urology intervention.   Goal of Therapy:  Heparin level 0.3-0.7 units/ml Monitor platelets by anticoagulation protocol: Yes   Plan:  Will not bolus patient due to hematuria and INR 1.6. Would not recommend bolus for dosage adjustments unless necessary.   Start heparin infusion at 1300 units/hr Check anti-Xa level in 6 hours and daily while on heparin Continue to monitor H&H and platelets   12/19 2355 HL subtherapeutic. Will give reduced bolus (2000 units IV x 1) and increase rate to 1500 units/hr. Recheck HL in 6 hours.  12/20 0744 HL: subtherapeutic at 0.25. Will increase rate to 1700 units/hr. Recheck HL in 6 hours.   Aran Menning M Harshita Bernales, Pharm.D., BCPS Clinical Pharmacist 01/12/2016,8:44 AM

## 2016-01-12 NOTE — Plan of Care (Signed)
Problem: Pain Managment: Goal: General experience of comfort will improve Outcome: Progressing Hydromorphone IV given times 2 (two) doses. Improvement noted. Morphine given times 1 (one) dose. Improvement noted. Vicodin 2 (two) tablets given times 2 (two) doses. Improvement noted.

## 2016-01-12 NOTE — Progress Notes (Signed)
Urology Consult Follow Up  Subjective: Right flank pain improving.  CBI stopped this AM, relatively clear until this evening with few small clots irrigated out. Currently dilute cranberry with CBI off.  C/o bladder spasms.  Cr improving.  On heparin gtt.    Anti-infectives: Anti-infectives    Start     Dose/Rate Route Frequency Ordered Stop   01/10/16 1500  cefTRIAXone (ROCEPHIN) IVPB 1 g     1 g 100 mL/hr over 30 Minutes Intravenous  Once 01/10/16 1446 01/10/16 1725   01/10/16 1445  cefTRIAXone (ROCEPHIN) 1 g in dextrose 5 % 50 mL IVPB  Status:  Discontinued     1 g 100 mL/hr over 30 Minutes Intravenous  Once 01/10/16 1442 01/10/16 1447      Current Facility-Administered Medications  Medication Dose Route Frequency Provider Last Rate Last Dose  . 0.9 %  sodium chloride infusion   Intravenous Continuous Arnaldo NatalMichael S Diamond, MD 150 mL/hr at 01/12/16 1709    . acetaminophen (TYLENOL) tablet 650 mg  650 mg Oral Q6H PRN Arnaldo NatalMichael S Diamond, MD   650 mg at 01/11/16 1754   Or  . acetaminophen (TYLENOL) suppository 650 mg  650 mg Rectal Q6H PRN Arnaldo NatalMichael S Diamond, MD      . alum & mag hydroxide-simeth (MAALOX/MYLANTA) 200-200-20 MG/5ML suspension 15 mL  15 mL Oral Q4H PRN [place] SirenVivek J Sainani, MD      . docusate sodium (COLACE) capsule 100 mg  100 mg Oral BID Arnaldo NatalMichael S Diamond, MD   100 mg at 01/12/16 1057  . heparin ADULT infusion 100 units/mL (25000 units/22050mL sodium chloride 0.45%)  1,900 Units/hr Intravenous Continuous Gardner CandleSheema M Hallaji, RPH 19 mL/hr at 01/12/16 1709 1,900 Units/hr at 01/12/16 1709  . HYDROcodone-acetaminophen (NORCO/VICODIN) 5-325 MG per tablet 2 tablet  2 tablet Oral Q4H PRN [place] SirenVivek J Sainani, MD   2 tablet at 01/12/16 1707  . HYDROmorphone (DILAUDID) injection 1 mg  1 mg Intravenous Q3H PRN [place] SirenVivek J Sainani, MD   1 mg at 01/12/16 1444  . morphine 4 MG/ML injection 4 mg  4 mg Intravenous Q4H PRN Arnaldo NatalMichael S Diamond, MD   4 mg at 01/12/16 1053  . ondansetron (ZOFRAN) tablet 4 mg  4 mg  Oral Q6H PRN Arnaldo NatalMichael S Diamond, MD       Or  . ondansetron [facility](ZOFRAN) injection 4 mg  4 mg Intravenous Q6H PRN Arnaldo NatalMichael S Diamond, MD      . oxybutynin [facility](DITROPAN) tablet 10 mg  10 mg Oral BID [place] SirenVivek J Sainani, MD   10 mg at 01/12/16 1057  . senna (SENOKOT) tablet 8.6 mg  1 tablet Oral BID [place] SirenVivek J Sainani, MD      . tamsulosin [facility](FLOMAX) capsule 0.4 mg  0.4 mg Oral Daily [place] SirenVivek J Sainani, MD   0.4 mg at 01/12/16 1057     Objective: Vital signs in last 24 hours: Temp:  [98.5 F (36.9 C)-100.7 F (38.2 C)] 98.5 F (36.9 C) (12/20 1552) Pulse Rate:  [51-108] 57 (12/20 1552) Resp:  [18-22] 20 (12/20 1552) BP: (137-169)/(85-91) 137/86 (12/20 1552) SpO2:  [91 %-97 %] 92 % (12/20 1552) Weight:  [202 lb (91.6 kg)] 202 lb (91.6 kg) (12/20 0547)  Intake/Output from previous day: 12/19 0701 - 12/20 0700 In: 4628.4 [P.O.:880; I.V.:3748.4] Out: [id number] [Urine:27550] Intake/Output this shift: Total I/O In: 2432.8 [P.O.:120; I.V.:2312.8] Out: 6000 [Urine:6000]   Physical Exam Constitutional: Well nourished. Alert and oriented, No acute distress. HEENT: [place] AT, moist mucus membranes. Trachea midline,  no masses. Cardiovascular: No clubbing, cyanosis, or edema. Respiratory: Normal respiratory effort, no increased work of breathing. GI: Abdomen is soft, non tender, non distended, no abdominal masses. Liver and spleen not palpable.  No hernias appreciated.  Stool sample for occult testing is not indicated.   GU: No CVA tenderness.  No bladder fullness or masses.  Patient with circumcised phallus. Three-way Foley in place draining dilute cranberry colored urine. Skin: No rashes, bruises or suspicious lesions. Neurologic: Grossly intact, no focal deficits, moving all 4 extremities. Psychiatric: Normal mood and affect.  Lab Results:   Recent Labs  01/11/16 1409 01/12/16 0744  WBC 20.0* 20.1*  HGB 12.4* 12.3*  HCT 35.7* 36.0*  PLT 151 151   BMET  Recent Labs  01/11/16 1409 01/12/16 0744  NA  134* 135  K 3.8 3.9  CL 103 104  CO2 23 24  GLUCOSE 131* 123*  BUN 15 14  CREATININE 1.37* 1.18  CALCIUM 8.1* 8.7*   PT/INR  Recent Labs  01/11/16 1534 01/12/16 0744  LABPROT 19.2* 18.8*  INR 1.60 1.56    Assessment: Clot retention- CBI turned off, hand irrigate as need.  If relatively clear, will d/c foley in am.  Continue heparin gtt.  Follow H/H.    Gross hematuria- Repeat CT Urogram to be repeated in 4-6 weeks as outpatient with cytoscopy  Right flank pain - Likely due to passage of clot and debris.  Conservative management.  Bladder spasms- B&O suppositories and ditropan 5 mg tid prn     LOS: 1 day    Vanna Scotlandshley Seymour Pavlak 01/12/2016

## 2016-01-12 NOTE — Progress Notes (Signed)
ANTICOAGULATION CONSULT NOTE - Initial Consult  Pharmacy Consult for Heparin Indication: AVR  No Known Allergies  Patient Measurements: Height: 6' 1.5" (186.7 cm) Weight: 207 lb (93.9 kg) IBW/kg (Calculated) : 81.05  Vital Signs: Temp: 99 F (37.2 C) (12/19 2034) Temp Source: Oral (12/19 2034) BP: 142/85 (12/19 2034) Pulse Rate: 51 (12/19 2034)  Labs:  Recent Labs  01/09/16 2234 01/11/16 1409 01/11/16 1534 01/11/16 2355  HGB 14.6 12.4*  --   --   HCT 42.4 35.7*  --   --   PLT 217 151  --   --   APTT  --   --  49*  --   LABPROT 31.5*  --  19.2*  --   INR 2.97  --  1.60  --   HEPARINUNFRC  --   --   --  0.17*  CREATININE 0.85 1.37*  --   --     Estimated Creatinine Clearance: 64.1 mL/min (by C-G formula based on SCr of 1.37 mg/dL (H)).   Medical History: History reviewed. No pertinent past medical history.  Medications:  Prescriptions Prior to Admission  Medication Sig Dispense Refill Last Dose  . aspirin EC 81 MG tablet Take 81 mg by mouth daily.   01/09/2016 at Unknown time  . HYDROcodone-acetaminophen (NORCO/VICODIN) 5-325 MG tablet Take 1 tablet by mouth every 6 (six) hours as needed for moderate pain.   prn at prn  . Multiple Vitamin (MULTIVITAMIN WITH MINERALS) TABS tablet Take 1 tablet by mouth daily.   Past Week at Unknown time  . warfarin (COUMADIN) 2 MG tablet Take 1-2 tablets by mouth as directed. Take 2 tablets (4 mg) on Monday and Friday. Take 1 tablet (2 mg) on Sunday, Tuesday, Thursday, and Saturday.  1 Past Week at Unknown time    Assessment: 60 y/o M with a h/o AVR on warfarin PTA to transition to heparin drip in case patient needs urology intervention.   Goal of Therapy:  Heparin level 0.3-0.7 units/ml Monitor platelets by anticoagulation protocol: Yes   Plan:  Will not bolus patient due to hematuria and INR 1.6. Would not recommend bolus for dosage adjustments unless necessary.  Start heparin infusion at 1300 units/hr Check anti-Xa level  in 6 hours and daily while on heparin Continue to monitor H&H and platelets   12/19 2355 HL subtherapeutic. Will give reduced bolus (2000 units IV x 1) and increase rate to 1500 units/hr. Recheck HL in 6 hours.  Carola FrostNathan A Saniyya Gau, Pharm.D., BCPS Clinical Pharmacist 01/12/2016,12:55 AM

## 2016-01-13 ENCOUNTER — Telehealth: Payer: Self-pay | Admitting: Urology

## 2016-01-13 DIAGNOSIS — R310 Gross hematuria: Secondary | ICD-10-CM

## 2016-01-13 LAB — CBC
HCT: 28.4 % — ABNORMAL LOW (ref 40.0–52.0)
Hemoglobin: 9.8 g/dL — ABNORMAL LOW (ref 13.0–18.0)
MCH: 33.1 pg (ref 26.0–34.0)
MCHC: 34.6 g/dL (ref 32.0–36.0)
MCV: 95.7 fL (ref 80.0–100.0)
Platelets: 149 10*3/uL — ABNORMAL LOW (ref 150–440)
RBC: 2.97 MIL/uL — ABNORMAL LOW (ref 4.40–5.90)
RDW: 12.7 % (ref 11.5–14.5)
WBC: 13.8 10*3/uL — ABNORMAL HIGH (ref 3.8–10.6)

## 2016-01-13 LAB — PROTIME-INR
INR: 1.37
PROTHROMBIN TIME: 17 s — AB (ref 11.4–15.2)

## 2016-01-13 LAB — HEPARIN LEVEL (UNFRACTIONATED)
HEPARIN UNFRACTIONATED: 0.38 [IU]/mL (ref 0.30–0.70)
Heparin Unfractionated: 3.02 IU/mL — ABNORMAL HIGH (ref 0.30–0.70)
Heparin Unfractionated: 0.21 IU/mL — ABNORMAL LOW (ref 0.30–0.70)
Heparin Unfractionated: 0.19 IU/mL — ABNORMAL LOW (ref 0.30–0.70)

## 2016-01-13 MED ORDER — HEPARIN (PORCINE) IN NACL 100-0.45 UNIT/ML-% IJ SOLN
2000.0000 [IU]/h | INTRAMUSCULAR | Status: DC
  Administered 2016-01-14: 05:00:00 2000 [IU]/h via INTRAVENOUS

## 2016-01-13 MED ORDER — WARFARIN SODIUM 5 MG PO TABS
5.0000 mg | ORAL_TABLET | Freq: Once | ORAL | Status: AC
  Administered 2016-01-13: 5 mg via ORAL
  Filled 2016-01-13: qty 1

## 2016-01-13 MED ORDER — WARFARIN - PHARMACIST DOSING INPATIENT
Freq: Every day | Status: DC
Start: 2016-01-13 — End: 2016-01-14
  Administered 2016-01-13: 18:00:00

## 2016-01-13 MED ORDER — HEPARIN (PORCINE) IN NACL 100-0.45 UNIT/ML-% IJ SOLN
1900.0000 [IU]/h | INTRAMUSCULAR | Status: DC
  Administered 2016-01-13: 01:00:00 1700 [IU]/h via INTRAVENOUS
  Administered 2016-01-13: 1900 [IU]/h via INTRAVENOUS
  Filled 2016-01-13 (×2): qty 250

## 2016-01-13 NOTE — Progress Notes (Addendum)
ANTICOAGULATION CONSULT NOTE - Initial Consult  Pharmacy Consult for Heparin Indication: AVR  No Known Allergies  Patient Measurements: Height: 6' 1.5" (186.7 cm) Weight: 200 lb (90.7 kg) IBW/kg (Calculated) : 81.05  Vital Signs: Temp: 99.3 F (37.4 C) (12/21 0433) Temp Source: Oral (12/21 0433) BP: 123/60 (12/21 0433) Pulse Rate: 104 (12/21 0433)  Labs:  Recent Labs  01/11/16 1409 01/11/16 1534  01/12/16 0744 01/12/16 1452 01/12/16 2116 01/13/16 0728  HGB 12.4*  --   --  12.3*  --   --  9.8*  HCT 35.7*  --   --  36.0*  --   --  28.4*  PLT 151  --   --  151  --   --  149*  APTT  --  49*  --   --   --   --   --   LABPROT  --  19.2*  --  18.8*  --   --  17.0*  INR  --  1.60  --  1.56  --   --  1.37  HEPARINUNFRC  --   --   < > 0.25* 0.25* 3.02* 0.21*  CREATININE 1.37*  --   --  1.18  --   --   --   < > = values in this interval not displayed.  Estimated Creatinine Clearance: 74.5 mL/min (by C-G formula based on SCr of 1.18 mg/dL).   Medical History: History reviewed. No pertinent past medical history.  Medications:  Prescriptions Prior to Admission  Medication Sig Dispense Refill Last Dose  . aspirin EC 81 MG tablet Take 81 mg by mouth daily.   01/09/2016 at Unknown time  . HYDROcodone-acetaminophen (NORCO/VICODIN) 5-325 MG tablet Take 1 tablet by mouth every 6 (six) hours as needed for moderate pain.   prn at prn  . Multiple Vitamin (MULTIVITAMIN WITH MINERALS) TABS tablet Take 1 tablet by mouth daily.   Past Week at Unknown time  . warfarin (COUMADIN) 2 MG tablet Take 1-2 tablets by mouth as directed. Take 2 tablets (4 mg) on Monday and Friday. Take 1 tablet (2 mg) on Sunday, Tuesday, Thursday, and Saturday.  1 Past Week at Unknown time    Assessment: 60 y/o M with a h/o AVR on warfarin PTA to transition to heparin drip in case patient needs urology intervention.   Goal of Therapy:  Heparin level 0.3-0.7 units/ml Monitor platelets by anticoagulation  protocol: Yes   Plan:  Will not bolus patient due to hematuria and INR 1.6. Would not recommend bolus for dosage adjustments unless necessary.  Start heparin infusion at 1300 units/hr Check anti-Xa level in 6 hours and daily while on heparin Continue to monitor H&H and platelets   12/19 2355 HL subtherapeutic. Will give reduced bolus (2000 units IV x 1) and increase rate to 1500 units/hr. Recheck HL in 6 hours.  12/20 0744 HL: subtherapeutic at 0.25. Will increase rate to 1700 units/hr. Recheck HL in 6 hours.   12/20  0450 HL subtherapeutic at 0.25. Spoke with nurse who confirmed drip has been running at 1700 units/ hr for last 6 hours and there have been no interruptions in therapy. Will increase heparin infusion to 1900units/hr. Recheck HL in 6 hours.   12/20 2116 HL supratherapeutic. Asked RN to hold x 1 hr. Will decrease to 1700 units/hr and recheck HL 6 hr after resuming.  12/21 0700 HL subtherapeutic at 0.21. Will increase infusion to 1900 units/hr again and recheck on 6 hours.   Kamyrah Feeser  M Kery Batzel, Pharm.D., BCPS Clinical Pharmacist 01/13/2016,8:19 AM

## 2016-01-13 NOTE — Progress Notes (Signed)
Urology Consult Follow Up  Subjective: Doing well this morning. CBI has been off most of the day yesterday and all night. Urine is relatively clear, scant clot.  foely removed this AM.  Hgb down this AM,remains on heparin gtt.  Minimal right flank pain.  Anti-infectives: Anti-infectives    Start     Dose/Rate Route Frequency Ordered Stop   01/10/16 1500  cefTRIAXone (ROCEPHIN) IVPB 1 g     1 g 100 mL/hr over 30 Minutes Intravenous  Once 01/10/16 1446 01/10/16 1725   01/10/16 1445  cefTRIAXone (ROCEPHIN) 1 g in dextrose 5 % 50 mL IVPB  Status:  Discontinued     1 g 100 mL/hr over 30 Minutes Intravenous  Once 01/10/16 1442 01/10/16 1447      Current Facility-Administered Medications  Medication Dose Route Frequency Provider Last Rate Last Dose  . 0.9 %  sodium chloride infusion   Intravenous Continuous Arnaldo NatalMichael S Diamond, MD 150 mL/hr at 01/13/16 1015    . acetaminophen (TYLENOL) tablet 650 mg  650 mg Oral Q6H PRN Arnaldo NatalMichael S Diamond, MD   650 mg at 01/11/16 1754   Or  . acetaminophen (TYLENOL) suppository 650 mg  650 mg Rectal Q6H PRN Arnaldo NatalMichael S Diamond, MD      . alum & mag hydroxide-simeth (MAALOX/MYLANTA) 200-200-20 MG/5ML suspension 15 mL  15 mL Oral Q4H PRN [place] SirenVivek J Sainani, MD      . docusate sodium (COLACE) capsule 100 mg  100 mg Oral BID Arnaldo NatalMichael S Diamond, MD   100 mg at 01/13/16 1015  . heparin ADULT infusion 100 units/mL (25000 units/27450mL sodium chloride 0.45%)  1,900 Units/hr Intravenous Continuous Sheema M Hallaji, RPH 19 mL/hr at 01/13/16 0830 1,900 Units/hr at 01/13/16 0830  . HYDROcodone-acetaminophen (NORCO/VICODIN) 5-325 MG per tablet 2 tablet  2 tablet Oral Q4H PRN [place] SirenVivek J Sainani, MD   2 tablet at 01/13/16 0829  . HYDROmorphone (DILAUDID) injection 1 mg  1 mg Intravenous Q3H PRN [place] SirenVivek J Sainani, MD   1 mg at 01/12/16 2019  . morphine 4 MG/ML injection 4 mg  4 mg Intravenous Q4H PRN Arnaldo NatalMichael S Diamond, MD   4 mg at 01/12/16 2252  . ondansetron (ZOFRAN) tablet 4 mg  4 mg Oral  Q6H PRN Arnaldo NatalMichael S Diamond, MD       Or  . ondansetron [facility](ZOFRAN) injection 4 mg  4 mg Intravenous Q6H PRN Arnaldo NatalMichael S Diamond, MD      . opium-belladonna (B&O SUPPRETTES) 16.2-60 MG suppository 1 suppository  1 suppository Rectal Q8H PRN Vanna ScotlandAshley Denym Rahimi, MD      . oxybutynin (DITROPAN) tablet 10 mg  10 mg Oral BID [place] SirenVivek J Sainani, MD   10 mg at 01/13/16 1015  . senna (SENOKOT) tablet 8.6 mg  1 tablet Oral BID [place] SirenVivek J Sainani, MD   8.6 mg at 01/13/16 1015  . tamsulosin (FLOMAX) capsule 0.4 mg  0.4 mg Oral Daily [place] SirenVivek J Sainani, MD   0.4 mg at 01/13/16 1016     Objective: Vital signs in last 24 hours: Temp:  [98.4 F (36.9 C)-99.3 F (37.4 C)] 99.3 F (37.4 C) (12/21 0433) Pulse Rate:  [57-108] 104 (12/21 0433) Resp:  [18-20] 18 (12/21 0433) BP: (123-141)/(60-96) 123/60 (12/21 0433) SpO2:  [91 %-93 %] 93 % (12/21 0433) Weight:  [200 lb (90.7 kg)] 200 lb (90.7 kg) (12/21 0433)  Intake/Output from previous day: 12/20 0701 - 12/21 0700 In: 3940.6 [P.O.:120; I.V.:3820.6] Out: [id number] [Urine:11700] Intake/Output this shift: Total I/O In:  240 [P.O.:240] Out: -    Physical Exam Constitutional: Well nourished. Alert and oriented, No acute distress. HEENT: [place] AT, moist mucus membranes. Trachea midline, no masses. Cardiovascular: No clubbing, cyanosis, or edema. Respiratory: Normal respiratory effort, no increased work of breathing. GI: Abdomen is soft, non tender, non distended, no abdominal masses.  GU: No CVA tenderness.  No bladder fullness or masses.  Patient with circumcised phallus. Three-way Foley in place draining pink tinged urine with rare clot.  Irrigated without significant clot burden.  Foley removed.   Skin: No rashes, bruises or suspicious lesions. Neurologic: Grossly intact, no focal deficits, moving all 4 extremities. Psychiatric: Normal mood and affect.  Lab Results:   Recent Labs  01/12/16 0744 01/13/16 0728  WBC 20.1* 13.8*  HGB 12.3* 9.8*  HCT 36.0* 28.4*  PLT  151 149*   BMET  Recent Labs  01/11/16 1409 01/12/16 0744  NA 134* 135  K 3.8 3.9  CL 103 104  CO2 23 24  GLUCOSE 131* 123*  BUN 15 14  CREATININE 1.37* 1.18  CALCIUM 8.1* 8.7*   PT/INR  Recent Labs  01/12/16 0744 01/13/16 0728  LABPROT 18.8* 17.0*  INR 1.56 1.37    Assessment: Clot retention- Foley now out, urine relatively clear despite heparin gtt.  Hbg down this AM, recommend recheck this afternoon.   Gross hematuria- Repeat CT Urogram to be repeated in 4-6 weeks as outpatient with cytoscopy, will arrange prior to discharge  Right flank pain - Likely due to passage of clot and debris.  Conservative management.   OK for discharge from GU perspective if voiding and Hgb stable this afternoon.  Discussed with Dr. Cherlynn KaiserSainani.     LOS: 2 days    Vanna Scotlandshley Anquinette Pierro 01/13/2016

## 2016-01-13 NOTE — Progress Notes (Signed)
Patient heparin gtt infusing at 19 ml/hr, infusing stopped for 1 hr per pharmacy due to lab result, verified pump off by Hazle CocaSarah Kleener, RN.

## 2016-01-13 NOTE — Progress Notes (Signed)
ANTICOAGULATION CONSULT NOTE - Initial Consult  Pharmacy Consult for warfarin  No Known Allergies  Patient Measurements: Height: 6' 1.5" (186.7 cm) Weight: 200 lb (90.7 kg) IBW/kg (Calculated) : 81.05  Vital Signs: Temp: 98.8 F (37.1 C) (12/21 1428) Temp Source: Oral (12/21 1428) BP: 129/73 (12/21 1428) Pulse Rate: 58 (12/21 1428)  Labs:  Recent Labs  01/11/16 1409 01/11/16 1534  01/12/16 0744  01/12/16 2116 01/13/16 0728 01/13/16 1354  HGB 12.4*  --   --  12.3*  --   --  9.8*  --   HCT 35.7*  --   --  36.0*  --   --  28.4*  --   PLT 151  --   --  151  --   --  149*  --   APTT  --  49*  --   --   --   --   --   --   LABPROT  --  19.2*  --  18.8*  --   --  17.0*  --   INR  --  1.60  --  1.56  --   --  1.37  --   HEPARINUNFRC  --   --   < > 0.25*  < > 3.02* 0.21* 0.38  CREATININE 1.37*  --   --  1.18  --   --   --   --   < > = values in this interval not displayed.  Estimated Creatinine Clearance: 74.5 mL/min (by C-G formula based on SCr of 1.18 mg/dL).   Assessment: 60 yo male with mechanical aortic valve replacement admitted for hematuria. Patient currently on heparin drip while inpatient. Pharmacy consulted for warfarin re-initiation.  Patient currently on heparin drip while waiting for INR to be therapeutic. Last dose of warfarin was 12/17.  PTA warfarin regimen: Warfarin 2mg   Tues and Thurs                                       Warfarin 4mg   All other days (S,M,W,F,Sat)   Date:  INR:  Dose: 12/21  1.37  5mg  12/22  Goal of Therapy:  INR 2-3 Monitor platelets by anticoagulation protocol: Yes   Plan:  Will give the patient 1 time dose of warfarin 5mg  PO today. Will resume patients home regimen tomorrow.   Gardner CandleSheema M Auriella Wieand, PharmD, BCPS Clinical Pharmacist 01/13/2016 2:50 PM

## 2016-01-13 NOTE — Progress Notes (Signed)
[facility] [place] [facility]   PATIENT NAME: Tim Wright    MR#:  [id number]  DATE OF BIRTH:  September 18, 1955  SUBJECTIVE:   Improved. Hemoglobin has trended down since admission. Still having some mild right flank pain but much better since admission. Foley catheter removed by urology today.  REVIEW OF SYSTEMS:    Review of Systems  Constitutional: Negative for chills and fever.  HENT: Negative for congestion and tinnitus.   Eyes: Negative for blurred vision and double vision.  Respiratory: Negative for cough, shortness of breath and wheezing.   Cardiovascular: Negative for chest pain, orthopnea and PND.  Gastrointestinal: Positive for abdominal pain. Negative for diarrhea, nausea and vomiting.  Genitourinary: Positive for hematuria. Negative for dysuria.  Neurological: Negative for dizziness, sensory change and focal weakness.  All other systems reviewed and are negative.   Nutrition: soft  Tolerating Diet: Yes Tolerating PT: Ambulatory   DRUG ALLERGIES:  No Known Allergies  VITALS:  Blood pressure 129/73, pulse (!) 58, temperature 99.3 F (37.4 C), temperature source Oral, resp. rate 18, height 6' 1.5" (1.867 m), weight 90.7 kg (200 lb), SpO2 96 %.  PHYSICAL EXAMINATION:   Physical Exam  GENERAL:  60 y.o.-year-old patient sitting on bedside commode but in NAD.  EYES: Pupils equal, round, reactive to light and accommodation. No scleral icterus. Extraocular muscles intact.  HEENT: Head atraumatic, normocephalic. Oropharynx and nasopharynx clear.  NECK:  Supple, no jugular venous distention. No thyroid enlargement, no tenderness.  LUNGS: Normal breath sounds bilaterally, no wheezing, rales, rhonchi. No use of accessory muscles of respiration.  CARDIOVASCULAR: S1, S2 normal. No murmurs, rubs, or gallops.  ABDOMEN: Soft, non-distended, mild right flank pain but No rebound, rigidity. Bowel sounds present. No organomegaly or mass.  EXTREMITIES: No  cyanosis, clubbing or edema b/l.    NEUROLOGIC: Cranial nerves II through XII are intact. No focal Motor or sensory deficits b/l.   PSYCHIATRIC: The patient is alert and oriented x 3.  SKIN: No obvious rash, lesion, or ulcer.    LABORATORY PANEL:   CBC  Recent Labs Lab 01/13/16 0728  WBC 13.8*  HGB 9.8*  HCT 28.4*  PLT 149*   ------------------------------------------------------------------------------------------------------------------  Chemistries   Recent Labs Lab 01/09/16 2234  01/12/16 0744  NA 134*  < > 135  K 3.6  < > 3.9  CL 103  < > 104  CO2 21*  < > 24  GLUCOSE 136*  < > 123*  BUN 11  < > 14  CREATININE 0.85  < > 1.18  CALCIUM 8.9  < > 8.7*  AST 35  --   --   ALT 17  --   --   ALKPHOS 79  --   --   BILITOT 0.5  --   --   < > = values in this interval not displayed. ------------------------------------------------------------------------------------------------------------------  Cardiac Enzymes No results for input(s): TROPONINI in the last 168 hours. ------------------------------------------------------------------------------------------------------------------  RADIOLOGY:  No results found.   ASSESSMENT AND PLAN:   60 yo male w/ hx of aortic valve replacement, presents to the hospital due to lower abdominal pain and hematuria and noted to have urinary retention with hydroureteronephrosis bilaterally.  1. Hematuria with bilateral hydroureteronephrosis-secondary to clot/hemorrage into the right sided ureter.   - Hg. Stable but has trended down.  No need for Transfusion for now. Will follow Hg.   - Hematuria improving and CBI removed by Urology.  - no plans for surgical intervention presently but  will need Repeat CT urogram as outpatient in 4-6 weeks to look for source.   - cont. Supportive care w/ pain control - etiology of hematuria unclear ?? Mass (vs) Nephrolithiasis.     2. Hx of Aortic Valve replacement  - cont. Heparin gtt and will  resume Coumadin again today. Follow INR to a goal of 2-3  3. Leukocytosis -stress mediated due to # 1.  - improved.   4. AKI - obstructive in nature due to the hydroureteronephrosis. - improved w/ IV fluids.   Likely d/c home tomorrow if Hg. Stable.   All the records are reviewed and case discussed with Care Management/Social Worker. Management plans discussed with the patient, family and they are in agreement.  CODE STATUS: Full code  DVT Prophylaxis: Heparin gtt.   TOTAL TIME TAKING CARE OF THIS PATIENT: 30 minutes.   POSSIBLE D/C IN 1-2 DAYS, DEPENDING ON CLINICAL CONDITION.   [name],Kamila Broda J M.D on 01/13/2016 at 2:32 PM  Between 7am to 6pm - Pager - [phone]  After 6pm go to [email or web address] - Social research officer, governmentpassword [email or web address] [email or web address]  [facility]  Office  [phone]  CC: Primary care physician; Tim DawnOREY,JOHN, MD

## 2016-01-13 NOTE — Progress Notes (Signed)
ANTICOAGULATION CONSULT NOTE - Initial Consult  Pharmacy Consult for Heparin Indication: AVR  No Known Allergies  Patient Measurements: Height: 6' 1.5" (186.7 cm) Weight: 200 lb (90.7 kg) IBW/kg (Calculated) : 81.05  Vital Signs: Temp: 98.8 F (37.1 C) (12/21 1428) Temp Source: Oral (12/21 1428) BP: 129/73 (12/21 1428) Pulse Rate: 58 (12/21 1428)  Labs:  Recent Labs  01/11/16 1409 01/11/16 1534  01/12/16 0744  01/13/16 0728 01/13/16 1354 01/13/16 1952  HGB 12.4*  --   --  12.3*  --  9.8*  --   --   HCT 35.7*  --   --  36.0*  --  28.4*  --   --   PLT 151  --   --  151  --  149*  --   --   APTT  --  49*  --   --   --   --   --   --   LABPROT  --  19.2*  --  18.8*  --  17.0*  --   --   INR  --  1.60  --  1.56  --  1.37  --   --   HEPARINUNFRC  --   --   < > 0.25*  < > 0.21* 0.38 0.19*  CREATININE 1.37*  --   --  1.18  --   --   --   --   < > = values in this interval not displayed.  Estimated Creatinine Clearance: 74.5 mL/min (by C-G formula based on SCr of 1.18 mg/dL).   Medical History: History reviewed. No pertinent past medical history.  Medications:  Prescriptions Prior to Admission  Medication Sig Dispense Refill Last Dose  . aspirin EC 81 MG tablet Take 81 mg by mouth daily.   01/09/2016 at Unknown time  . HYDROcodone-acetaminophen (NORCO/VICODIN) 5-325 MG tablet Take 1 tablet by mouth every 6 (six) hours as needed for moderate pain.   prn at prn  . Multiple Vitamin (MULTIVITAMIN WITH MINERALS) TABS tablet Take 1 tablet by mouth daily.   Past Week at Unknown time  . warfarin (COUMADIN) 2 MG tablet Take 1-2 tablets by mouth as directed. 2 tablets everyday except for tues and thursdays. 1 tablet on tues and Thursday.  1 Past Week at Unknown time    Assessment: 60 y/o M with a h/o AVR on warfarin PTA to transition to heparin drip in case patient needs urology intervention.   Goal of Therapy:  Heparin level 0.3-0.7 units/ml Monitor platelets by  anticoagulation protocol: Yes   Plan:  Will not bolus patient due to hematuria and INR 1.6. Would not recommend bolus for dosage adjustments unless necessary.  Start heparin infusion at 1300 units/hr Check anti-Xa level in 6 hours and daily while on heparin Continue to monitor H&H and platelets   12/19 2355 HL subtherapeutic. Will give reduced bolus (2000 units IV x 1) and increase rate to 1500 units/hr. Recheck HL in 6 hours.  12/20 0744 HL: subtherapeutic at 0.25. Will increase rate to 1700 units/hr. Recheck HL in 6 hours.   12/20  0450 HL subtherapeutic at 0.25. Spoke with nurse who confirmed drip has been running at 1700 units/ hr for last 6 hours and there have been no interruptions in therapy. Will increase heparin infusion to 1900units/hr. Recheck HL in 6 hours.   12/20 2116 HL supratherapeutic. Asked RN to hold x 1 hr. Will decrease to 1700 units/hr and recheck HL 6 hr after resuming.  12/21 0700  HL subtherapeutic at 0.21. Will increase infusion to 1900 units/hr again and recheck on 6 hours.   12/21 1354 HL therapeutic at 0.38. Will continue heparin infusion at 1900 units/hr and recheck HL in 6 hours. If therapeutic, will move to daily HL with am labs.   12/21 1952 HL = 0.19 subtherapeutic. Spoke with RN - no interruptions or issues with infusion that she is aware of. Will increase to 2000 units/hr and recheck HL in 6 hours. CBC ordered with AM labs tomorrow.  Cindi CarbonMary M France Noyce, Pharm.D. Clinical Pharmacist 01/13/2016,8:50 PM

## 2016-01-13 NOTE — Telephone Encounter (Signed)
done

## 2016-01-13 NOTE — Progress Notes (Signed)
ANTICOAGULATION CONSULT NOTE - Initial Consult  Pharmacy Consult for Heparin Indication: AVR  No Known Allergies  Patient Measurements: Height: 6' 1.5" (186.7 cm) Weight: 202 lb (91.6 kg) IBW/kg (Calculated) : 81.05  Vital Signs: Temp: 98.4 F (36.9 C) (12/20 2017) Temp Source: Oral (12/20 2017) BP: 141/96 (12/20 2017) Pulse Rate: 106 (12/20 2017)  Labs:  Recent Labs  01/11/16 1409 01/11/16 1534  01/12/16 0744 01/12/16 1452 01/12/16 2116  HGB 12.4*  --   --  12.3*  --   --   HCT 35.7*  --   --  36.0*  --   --   PLT 151  --   --  151  --   --   APTT  --  49*  --   --   --   --   LABPROT  --  19.2*  --  18.8*  --   --   INR  --  1.60  --  1.56  --   --   HEPARINUNFRC  --   --   < > 0.25* 0.25* 3.02*  CREATININE 1.37*  --   --  1.18  --   --   < > = values in this interval not displayed.  Estimated Creatinine Clearance: 74.5 mL/min (by C-G formula based on SCr of 1.18 mg/dL).   Medical History: History reviewed. No pertinent past medical history.  Medications:  Prescriptions Prior to Admission  Medication Sig Dispense Refill Last Dose  . aspirin EC 81 MG tablet Take 81 mg by mouth daily.   01/09/2016 at Unknown time  . HYDROcodone-acetaminophen (NORCO/VICODIN) 5-325 MG tablet Take 1 tablet by mouth every 6 (six) hours as needed for moderate pain.   prn at prn  . Multiple Vitamin (MULTIVITAMIN WITH MINERALS) TABS tablet Take 1 tablet by mouth daily.   Past Week at Unknown time  . warfarin (COUMADIN) 2 MG tablet Take 1-2 tablets by mouth as directed. Take 2 tablets (4 mg) on Monday and Friday. Take 1 tablet (2 mg) on Sunday, Tuesday, Thursday, and Saturday.  1 Past Week at Unknown time    Assessment: 60 y/o M with a h/o AVR on warfarin PTA to transition to heparin drip in case patient needs urology intervention.   Goal of Therapy:  Heparin level 0.3-0.7 units/ml Monitor platelets by anticoagulation protocol: Yes   Plan:  Will not bolus patient due to hematuria  and INR 1.6. Would not recommend bolus for dosage adjustments unless necessary.  Start heparin infusion at 1300 units/hr Check anti-Xa level in 6 hours and daily while on heparin Continue to monitor H&H and platelets   12/19 2355 HL subtherapeutic. Will give reduced bolus (2000 units IV x 1) and increase rate to 1500 units/hr. Recheck HL in 6 hours.  12/20 0744 HL: subtherapeutic at 0.25. Will increase rate to 1700 units/hr. Recheck HL in 6 hours.   12/20  0450 HL subtherapeutic at 0.25. Spoke with nurse who confirmed drip has been running at 1700 units/ hr for last 6 hours and there have been no interruptions in therapy. Will increase heparin infusion to 1900units/hr. Recheck HL in 6 hours.   12/20 2116 HL supratherapeutic. Asked RN to hold x 1 hr. Will decrease to 1700 units/hr and recheck HL 6 hr after resuming.  Carola FrostNathan A Cache Bills, Pharm.D., BCPS Clinical Pharmacist 01/13/2016,12:10 AM

## 2016-01-13 NOTE — Progress Notes (Signed)
ANTICOAGULATION CONSULT NOTE - Initial Consult  Pharmacy Consult for Heparin Indication: AVR  No Known Allergies  Patient Measurements: Height: 6' 1.5" (186.7 cm) Weight: 200 lb (90.7 kg) IBW/kg (Calculated) : 81.05  Vital Signs: Temp: 98.8 F (37.1 C) (12/21 1428) Temp Source: Oral (12/21 1428) BP: 129/73 (12/21 1428) Pulse Rate: 58 (12/21 1428)  Labs:  Recent Labs  01/11/16 1409 01/11/16 1534  01/12/16 0744  01/12/16 2116 01/13/16 0728 01/13/16 1354  HGB 12.4*  --   --  12.3*  --   --  9.8*  --   HCT 35.7*  --   --  36.0*  --   --  28.4*  --   PLT 151  --   --  151  --   --  149*  --   APTT  --  49*  --   --   --   --   --   --   LABPROT  --  19.2*  --  18.8*  --   --  17.0*  --   INR  --  1.60  --  1.56  --   --  1.37  --   HEPARINUNFRC  --   --   < > 0.25*  < > 3.02* 0.21* 0.38  CREATININE 1.37*  --   --  1.18  --   --   --   --   < > = values in this interval not displayed.  Estimated Creatinine Clearance: 74.5 mL/min (by C-G formula based on SCr of 1.18 mg/dL).   Medical History: History reviewed. No pertinent past medical history.  Medications:  Prescriptions Prior to Admission  Medication Sig Dispense Refill Last Dose  . aspirin EC 81 MG tablet Take 81 mg by mouth daily.   01/09/2016 at Unknown time  . HYDROcodone-acetaminophen (NORCO/VICODIN) 5-325 MG tablet Take 1 tablet by mouth every 6 (six) hours as needed for moderate pain.   prn at prn  . Multiple Vitamin (MULTIVITAMIN WITH MINERALS) TABS tablet Take 1 tablet by mouth daily.   Past Week at Unknown time  . warfarin (COUMADIN) 2 MG tablet Take 1-2 tablets by mouth as directed. 2 tablets everyday except for tues and thursdays. 1 tablet on tues and Thursday.  1 Past Week at Unknown time    Assessment: 60 y/o M with a h/o AVR on warfarin PTA to transition to heparin drip in case patient needs urology intervention.   Goal of Therapy:  Heparin level 0.3-0.7 units/ml Monitor platelets by  anticoagulation protocol: Yes   Plan:  Will not bolus patient due to hematuria and INR 1.6. Would not recommend bolus for dosage adjustments unless necessary.  Start heparin infusion at 1300 units/hr Check anti-Xa level in 6 hours and daily while on heparin Continue to monitor H&H and platelets   12/19 2355 HL subtherapeutic. Will give reduced bolus (2000 units IV x 1) and increase rate to 1500 units/hr. Recheck HL in 6 hours.  12/20 0744 HL: subtherapeutic at 0.25. Will increase rate to 1700 units/hr. Recheck HL in 6 hours.   12/20  0450 HL subtherapeutic at 0.25. Spoke with nurse who confirmed drip has been running at 1700 units/ hr for last 6 hours and there have been no interruptions in therapy. Will increase heparin infusion to 1900units/hr. Recheck HL in 6 hours.   12/20 2116 HL supratherapeutic. Asked RN to hold x 1 hr. Will decrease to 1700 units/hr and recheck HL 6 hr after resuming.  12/21 0700  HL subtherapeutic at 0.21. Will increase infusion to 1900 units/hr again and recheck on 6 hours.   12/21 1354 HL therapeutic at 0.38. Will continue heparin infusion at 1900 units/hr and recheck HL in 6 hours. If therapeutic, will move to daily HL with am labs.   Rohaan Durnil M Coleta Grosshans, Pharm.D., BCPS Clinical Pharmacist 01/13/2016,2:54 PM

## 2016-01-13 NOTE — Telephone Encounter (Signed)
This inpatient will be discharged in the near future. He needs a CT urogram in 4-6 weeks and follow-up with me thereafter for cystoscopy. Order placed today. Please arrange.  Vanna ScotlandAshley Mannie Wineland, MD

## 2016-01-14 LAB — HEPARIN LEVEL (UNFRACTIONATED)
HEPARIN UNFRACTIONATED: 0.41 [IU]/mL (ref 0.30–0.70)
Heparin Unfractionated: 0.34 IU/mL (ref 0.30–0.70)

## 2016-01-14 LAB — CBC
HEMATOCRIT: 26.5 % — AB (ref 40.0–52.0)
Hemoglobin: 9.3 g/dL — ABNORMAL LOW (ref 13.0–18.0)
MCH: 33.4 pg (ref 26.0–34.0)
MCHC: 35.2 g/dL (ref 32.0–36.0)
MCV: 95.1 fL (ref 80.0–100.0)
PLATELETS: 180 10*3/uL (ref 150–440)
RBC: 2.79 MIL/uL — ABNORMAL LOW (ref 4.40–5.90)
RDW: 13.1 % (ref 11.5–14.5)
WBC: 9 10*3/uL (ref 3.8–10.6)

## 2016-01-14 LAB — PROTIME-INR
INR: 1.4
Prothrombin Time: 17.3 seconds — ABNORMAL HIGH (ref 11.4–15.2)

## 2016-01-14 MED ORDER — TAMSULOSIN HCL 0.4 MG PO CAPS: 0.4000 mg | ORAL_CAPSULE | Freq: Every day | ORAL | 1 refills | Status: DC

## 2016-01-14 MED ORDER — HYDROCODONE-ACETAMINOPHEN 5-325 MG PO TABS: 2.0000 | ORAL_TABLET | ORAL | 0 refills | Status: AC | PRN

## 2016-01-14 MED ORDER — WARFARIN SODIUM 4 MG PO TABS
4.0000 mg | ORAL_TABLET | Freq: Once | ORAL | Status: AC
  Administered 2016-01-14: 10:00:00 4 mg via ORAL
  Filled 2016-01-14: qty 1

## 2016-01-14 MED ORDER — OXYBUTYNIN CHLORIDE 5 MG PO TABS: 10.0000 mg | ORAL_TABLET | Freq: Two times a day (BID) | ORAL | 1 refills | Status: DC

## 2016-01-14 MED ORDER — WARFARIN SODIUM 4 MG PO TABS: 2.0000 mg | ORAL_TABLET | ORAL | Status: DC

## 2016-01-14 MED ORDER — ENOXAPARIN SODIUM 80 MG/0.8ML ~~LOC~~ SOLN: 1.0000 mg/kg | SUBCUTANEOUS | 0 refills | Status: DC

## 2016-01-14 MED ORDER — WARFARIN SODIUM 4 MG PO TABS: 4.0000 mg | ORAL_TABLET | ORAL | Status: DC

## 2016-01-14 NOTE — Progress Notes (Signed)
ANTICOAGULATION CONSULT NOTE - Initial Consult  Pharmacy Consult for warfarin  No Known Allergies  Patient Measurements: Height: 6' 1.5" (186.7 cm) Weight: 200 lb 1.6 oz (90.8 kg) IBW/kg (Calculated) : 81.05  Vital Signs: Temp: 98.4 F (36.9 C) (12/22 0429) Temp Source: Oral (12/22 0429) BP: 118/63 (12/22 0429) Pulse Rate: 88 (12/22 0429)  Labs:  Recent Labs  01/11/16 1409  01/11/16 1534  01/12/16 0744  01/13/16 0728 01/13/16 1354 01/13/16 1952 01/14/16 0458  HGB 12.4*  --   --   --  12.3*  --  9.8*  --   --  9.3*  HCT 35.7*  --   --   --  36.0*  --  28.4*  --   --  26.5*  PLT 151  --   --   --  151  --  149*  --   --  180  APTT  --   --  49*  --   --   --   --   --   --   --   LABPROT  --   < > 19.2*  --  18.8*  --  17.0*  --   --  17.3*  INR  --   < > 1.60  --  1.56  --  1.37  --   --  1.40  HEPARINUNFRC  --   --   --   < > 0.25*  < > 0.21* 0.38 0.19* 0.34  CREATININE 1.37*  --   --   --  1.18  --   --   --   --   --   < > = values in this interval not displayed.  Estimated Creatinine Clearance: 74.5 mL/min (by C-G formula based on SCr of 1.18 mg/dL).   Assessment: 60 yo male with mechanical aortic valve replacement admitted for hematuria. Patient currently on heparin drip while inpatient. Pharmacy consulted for warfarin re-initiation.  Patient currently on heparin drip while waiting for INR to be therapeutic. Last dose of warfarin was 12/17.  PTA warfarin regimen: Warfarin 2mg   Tues and Thurs                                       Warfarin 4mg   All other days (S,M,W,F,Sat)   Date:  INR:  Dose: 12/21  1.37  5mg  12/22  1.40  4mg   Goal of Therapy:  INR 2-3 Monitor platelets by anticoagulation protocol: Yes   Plan:  As expected, patients INR is subtherapeutic today. Patients INR was within therapeutic range at admission.   Will resume patients home regimen of 2mg  on Tues and Thurs, and 4mg  all other days. Will take 4-5 days for INR to be within therapeutic/goal  range of 2-3.   Gardner CandleSheema M Sebasthian Stailey, PharmD, BCPS Clinical Pharmacist 01/14/2016 8:29 AM

## 2016-01-14 NOTE — Progress Notes (Signed)
ANTICOAGULATION CONSULT NOTE - Initial Consult  Pharmacy Consult for Heparin Indication: AVR  No Known Allergies  Patient Measurements: Height: 6' 1.5" (186.7 cm) Weight: 200 lb (90.7 kg) IBW/kg (Calculated) : 81.05  Vital Signs: Temp: 98.4 F (36.9 C) (12/22 0429) Temp Source: Oral (12/22 0429) BP: 118/63 (12/22 0429) Pulse Rate: 88 (12/22 0429)  Labs:  Recent Labs  01/11/16 1409  01/11/16 1534  01/12/16 0744  01/13/16 0728 01/13/16 1354 01/13/16 1952 01/14/16 0458  HGB 12.4*  --   --   --  12.3*  --  9.8*  --   --  9.3*  HCT 35.7*  --   --   --  36.0*  --  28.4*  --   --  26.5*  PLT 151  --   --   --  151  --  149*  --   --  180  APTT  --   --  49*  --   --   --   --   --   --   --   LABPROT  --   < > 19.2*  --  18.8*  --  17.0*  --   --  17.3*  INR  --   < > 1.60  --  1.56  --  1.37  --   --  1.40  HEPARINUNFRC  --   --   --   < > 0.25*  < > 0.21* 0.38 0.19* 0.34  CREATININE 1.37*  --   --   --  1.18  --   --   --   --   --   < > = values in this interval not displayed.  Estimated Creatinine Clearance: 74.5 mL/min (by C-G formula based on SCr of 1.18 mg/dL).   Medical History: History reviewed. No pertinent past medical history.  Medications:  Prescriptions Prior to Admission  Medication Sig Dispense Refill Last Dose  . aspirin EC 81 MG tablet Take 81 mg by mouth daily.   01/09/2016 at Unknown time  . HYDROcodone-acetaminophen (NORCO/VICODIN) 5-325 MG tablet Take 1 tablet by mouth every 6 (six) hours as needed for moderate pain.   prn at prn  . Multiple Vitamin (MULTIVITAMIN WITH MINERALS) TABS tablet Take 1 tablet by mouth daily.   Past Week at Unknown time  . warfarin (COUMADIN) 2 MG tablet Take 1-2 tablets by mouth as directed. 2 tablets everyday except for tues and thursdays. 1 tablet on tues and Thursday.  1 Past Week at Unknown time    Assessment: 60 y/o M with a h/o AVR on warfarin PTA to transition to heparin drip in case patient needs urology  intervention.   Goal of Therapy:  Heparin level 0.3-0.7 units/ml Monitor platelets by anticoagulation protocol: Yes   Plan:  Will not bolus patient due to hematuria and INR 1.6. Would not recommend bolus for dosage adjustments unless necessary.  Start heparin infusion at 1300 units/hr Check anti-Xa level in 6 hours and daily while on heparin Continue to monitor H&H and platelets   12/19 2355 HL subtherapeutic. Will give reduced bolus (2000 units IV x 1) and increase rate to 1500 units/hr. Recheck HL in 6 hours.  12/20 0744 HL: subtherapeutic at 0.25. Will increase rate to 1700 units/hr. Recheck HL in 6 hours.   12/20  0450 HL subtherapeutic at 0.25. Spoke with nurse who confirmed drip has been running at 1700 units/ hr for last 6 hours and there have been no interruptions in therapy.  Will increase heparin infusion to 1900units/hr. Recheck HL in 6 hours.   12/20 2116 HL supratherapeutic. Asked RN to hold x 1 hr. Will decrease to 1700 units/hr and recheck HL 6 hr after resuming.  12/21 0700 HL subtherapeutic at 0.21. Will increase infusion to 1900 units/hr again and recheck on 6 hours.   12/21 1354 HL therapeutic at 0.38. Will continue heparin infusion at 1900 units/hr and recheck HL in 6 hours. If therapeutic, will move to daily HL with am labs.   12/21 1952 HL = 0.19 subtherapeutic. Spoke with RN - no interruptions or issues with infusion that she is aware of. Will increase to 2000 units/hr and recheck HL in 6 hours. CBC ordered with AM labs tomorrow.  12/22 0458 HL therapeutic x 1. Continue current rate. Recheck HL in 6 hours.  Carola FrostNathan A Mansi Tokar, Pharm.D., BCPS Clinical Pharmacist 01/14/2016,5:30 AM

## 2016-01-14 NOTE — Progress Notes (Signed)
ANTICOAGULATION CONSULT NOTE - Initial Consult  Pharmacy Consult for Heparin Indication: AVR  No Known Allergies  Patient Measurements: Height: 6' 1.5" (186.7 cm) Weight: 200 lb 1.6 oz (90.8 kg) IBW/kg (Calculated) : 81.05  Vital Signs: Temp: 98.4 F (36.9 C) (12/22 0911) Temp Source: Oral (12/22 0911) BP: 147/83 (12/22 0911) Pulse Rate: 94 (12/22 0911)  Labs:  Recent Labs  01/11/16 1409  01/11/16 1534  01/12/16 0744  01/13/16 0728  01/13/16 1952 01/14/16 0458 01/14/16 1051  HGB 12.4*  --   --   --  12.3*  --  9.8*  --   --  9.3*  --   HCT 35.7*  --   --   --  36.0*  --  28.4*  --   --  26.5*  --   PLT 151  --   --   --  151  --  149*  --   --  180  --   APTT  --   --  49*  --   --   --   --   --   --   --   --   LABPROT  --   < > 19.2*  --  18.8*  --  17.0*  --   --  17.3*  --   INR  --   < > 1.60  --  1.56  --  1.37  --   --  1.40  --   HEPARINUNFRC  --   --   --   < > 0.25*  < > 0.21*  < > 0.19* 0.34 0.41  CREATININE 1.37*  --   --   --  1.18  --   --   --   --   --   --   < > = values in this interval not displayed.  Estimated Creatinine Clearance: 74.5 mL/min (by C-G formula based on SCr of 1.18 mg/dL).   Medical History: History reviewed. No pertinent past medical history.  Medications:  Prescriptions Prior to Admission  Medication Sig Dispense Refill Last Dose  . aspirin EC 81 MG tablet Take 81 mg by mouth daily.   01/09/2016 at Unknown time  . HYDROcodone-acetaminophen (NORCO/VICODIN) 5-325 MG tablet Take 1 tablet by mouth every 6 (six) hours as needed for moderate pain.   prn at prn  . Multiple Vitamin (MULTIVITAMIN WITH MINERALS) TABS tablet Take 1 tablet by mouth daily.   Past Week at Unknown time  . warfarin (COUMADIN) 2 MG tablet Take 1-2 tablets by mouth as directed. 2 tablets everyday except for tues and thursdays. 1 tablet on tues and Thursday.  1 Past Week at Unknown time    Assessment: 60 y/o M with a h/o AVR on warfarin PTA to transition to  heparin drip in case patient needs urology intervention.   Goal of Therapy:  Heparin level 0.3-0.7 units/ml Monitor platelets by anticoagulation protocol: Yes   Plan:  12/20  0450 HL subtherapeutic at 0.25. Spoke with nurse who confirmed drip has been running at 1700 units/ hr for last 6 hours and there have been no interruptions in therapy. Will increase heparin infusion to 1900units/hr. Recheck HL in 6 hours.   12/20 2116 HL supratherapeutic. Asked RN to hold x 1 hr. Will decrease to 1700 units/hr and recheck HL 6 hr after resuming.  12/21 0700 HL subtherapeutic at 0.21. Will increase infusion to 1900 units/hr again and recheck on 6 hours.   12/21 1354 HL therapeutic at  0.38. Will continue heparin infusion at 1900 units/hr and recheck HL in 6 hours. If therapeutic, will move to daily HL with am labs.   12/21 1952 HL = 0.19 subtherapeutic. Spoke with RN - no interruptions or issues with infusion that she is aware of. Will increase to 2000 units/hr and recheck HL in 6 hours. CBC ordered with AM labs tomorrow.  12/22 0458 HL therapeutic x 1. Continue current rate. Recheck HL in 6 hours.  12/22 1051 HL 0.41- Therapeutic x2. Will continue current rate of 2000/units/hr and recheck HL with AM labs. CBC ordered. PLan to D/C drip when INR therapeutic.   Croy Drumwright M Samuel Mcpeek, Pharm.D., BCPS Clinical Pharmacist 01/14/2016,1:18 PM

## 2016-01-14 NOTE — Progress Notes (Signed)
MD making rounds. Received order to discharge home. IV's removed. Prescriptions given to patient. Discharge paperwork provided, explained, signed and witnessed. No unanswered questions. Discharged via wheelchair by nursing staff. Belongings sent with patient and wife.

## 2016-01-14 NOTE — Discharge Summary (Signed)
[facility] [place] [facility], [place]   PATIENT NAME: Tim Wright    MR#:  [id number]  DATE OF BIRTH:  25-Nov-1955  DATE OF ADMISSION:  01/09/2016 ADMITTING PHYSICIAN: Arnaldo Natal, MD  DATE OF DISCHARGE: 01/14/2016  PRIMARY CARE PHYSICIAN: Lupita Dawn, MD    ADMISSION DIAGNOSIS:  Lower abdominal pain [R10.30] Hematuria [R31.9]  DISCHARGE DIAGNOSIS:  Active Problems:   Hematuria   SECONDARY DIAGNOSIS:  History reviewed. No pertinent past medical history. Hx of Aortic Valve replacement.   HOSPITAL COURSE:   60 yo male w/ hx of aortic valve replacement, presents to the hospital due to lower abdominal pain and hematuria and noted to have urinary retention with hydroureteronephrosis bilaterally.  1. Hematuria with bilateral hydroureteronephrosis-secondary to clot/hemorrage into the right sided ureter.   - She patient was treated supportively with IV pain control, continuous bladder irrigation. She has clinically improved with supportive therapy. His anticoagulation was held initially but has been resumed now. He continues to have mild hematuria but his hemoglobin is stable. -Patient was seen by urology and therefore follow-up the patient and repeat a CT urogram in the next 4-6 weeks to figure out the etiology of his bleed. His INR was not supratherapeutic or the cause of his hematuria.   2. Hx of Aortic Valve replacement  -Patient's Coumadin was initially held. His INR level was stable. He was slowly started on a heparin drip once his hematuria started to improve. He is being discharged on a Lovenox bridge along with his Coumadin to follow his INR to between 2 and 3.  3. Leukocytosis -stress mediated due to # 1 and it has now normalized.  4. AKI - obstructive in nature due to the hydroureteronephrosis. - improved and resolved as his obstruction has been relieved.    DISCHARGE CONDITIONS:   Stable.   CONSULTS OBTAINED:  Treatment Team:  Crist Fat, MD  DRUG ALLERGIES:  No Known Allergies  DISCHARGE MEDICATIONS:   Allergies as of 01/14/2016   No Known Allergies     Medication List    STOP taking these medications   aspirin EC 81 MG tablet     TAKE these medications   enoxaparin 80 MG/0.8ML injection Commonly known as:  LOVENOX Inject 0.9 mLs (90 mg total) into the skin daily.   HYDROcodone-acetaminophen 5-325 MG tablet Commonly known as:  NORCO/VICODIN Take 2 tablets by mouth every 4 (four) hours as needed for moderate pain. What changed:  how much to take  when to take this   multivitamin with minerals Tabs tablet Take 1 tablet by mouth daily.   oxybutynin 5 MG tablet Commonly known as:  DITROPAN Take 2 tablets (10 mg total) by mouth 2 (two) times daily.   tamsulosin 0.4 MG Caps capsule Commonly known as:  FLOMAX Take 1 capsule (0.4 mg total) by mouth daily. Start taking on:  01/15/2016   warfarin 2 MG tablet Commonly known as:  COUMADIN Take 1-2 tablets by mouth as directed. 2 tablets everyday except for tues and thursdays. 1 tablet on tues and Thursday.         DISCHARGE INSTRUCTIONS:   DIET:  Regular diet  DISCHARGE CONDITION:  Stable  ACTIVITY:  Activity as tolerated  OXYGEN:  Home Oxygen: No.   Oxygen Delivery: room air  DISCHARGE LOCATION:  home   If you experience worsening of your admission symptoms, develop shortness of breath, life threatening emergency, suicidal or homicidal thoughts you must seek medical attention immediately by calling  911 or calling your MD immediately  if symptoms less severe.  You Must read complete instructions/literature along with all the possible adverse reactions/side effects for all the Medicines you take and that have been prescribed to you. Take any new Medicines after you have completely understood and accpet all the possible adverse reactions/side effects.   Please note  You were cared for by a hospitalist during your hospital stay.  If you have any questions about your discharge medications or the care you received while you were in the hospital after you are discharged, you can call the unit and asked to speak with the hospitalist on call if the hospitalist that took care of you is not available. Once you are discharged, your primary care physician will handle any further medical issues. Please note that NO REFILLS for any discharge medications will be authorized once you are discharged, as it is imperative that you return to your primary care physician (or establish a relationship with a primary care physician if you do not have one) for your aftercare needs so that they can reassess your need for medications and monitor your lab values.     Today   Still has some Hematuria, but improved.  Hg. Stable. Right sided flank pain improved. Wife at bedside.   VITAL SIGNS:  Blood pressure (!) 147/83, pulse 94, temperature 98.4 F (36.9 C), temperature source Oral, resp. rate 20, height 6' 1.5" (1.867 m), weight 90.8 kg (200 lb 1.6 oz), SpO2 96 %.  I/O:   Intake/Output Summary (Last 24 hours) at 01/14/16 1345 Last data filed at 01/14/16 0956  Gross per 24 hour  Intake          4297.17 ml  Output             2700 ml  Net          1597.17 ml    PHYSICAL EXAMINATION:  GENERAL:  60 y.o.-year-old patient lying in the bed with no acute distress.  EYES: Pupils equal, round, reactive to light and accommodation. No scleral icterus. Extraocular muscles intact.  HEENT: Head atraumatic, normocephalic. Oropharynx and nasopharynx clear.  NECK:  Supple, no jugular venous distention. No thyroid enlargement, no tenderness.  LUNGS: Normal breath sounds bilaterally, no wheezing, rales,rhonchi. No use of accessory muscles of respiration.  CARDIOVASCULAR: S1, S2 normal. No murmurs, rubs, or gallops.  ABDOMEN: Soft, non-tender, non-distended. Bowel sounds present. No organomegaly or mass.  EXTREMITIES: No pedal edema, cyanosis, or clubbing.   NEUROLOGIC: Cranial nerves II through XII are intact. No focal motor or sensory defecits b/l.  PSYCHIATRIC: The patient is alert and oriented x 3. Good affect.  SKIN: No obvious rash, lesion, or ulcer.   DATA REVIEW:   CBC  Recent Labs Lab 01/14/16 0458  WBC 9.0  HGB 9.3*  HCT 26.5*  PLT 180    Chemistries   Recent Labs Lab 01/09/16 2234  01/12/16 0744  NA 134*  < > 135  K 3.6  < > 3.9  CL 103  < > 104  CO2 21*  < > 24  GLUCOSE 136*  < > 123*  BUN 11  < > 14  CREATININE 0.85  < > 1.18  CALCIUM 8.9  < > 8.7*  AST 35  --   --   ALT 17  --   --   ALKPHOS 79  --   --   BILITOT 0.5  --   --   < > = values in this  interval not displayed.  Cardiac Enzymes No results for input(s): TROPONINI in the last 168 hours.  Microbiology Results  Results for orders placed or performed during the hospital encounter of 01/09/16  Urine culture     Status: None   Collection Time: 01/10/16  3:23 PM  Result Value Ref Range Status   Specimen Description URINE, RANDOM  Final   Special Requests NONE  Final   Culture NO GROWTH Performed [facility]   Final   Report Status 01/11/2016 FINAL  Final    RADIOLOGY:  No results found.    Management plans discussed with the patient, family and they are in agreement.  CODE STATUS:     Code Status Orders        Start     Ordered   01/10/16 0803  Full code  Continuous     01/10/16 0802    Code Status History    Date Active Date Inactive Code Status Order ID Comments User Context   This patient has a current code status but no historical code status.      TOTAL TIME TAKING CARE OF THIS PATIENT: 40 minutes.    [name],Lamiracle Chaidez J M.D on 01/14/2016 at 1:45 PM  Between 7am to 6pm - Pager - [phone]  After 6pm go to [email or web address] - Social research officer, governmentpassword [email or web address] [email or web address]  [facility]  Office  [phone]  CC: Primary care physician; Lupita DawnOREY,JOHN, MD

## 2016-01-14 NOTE — Discharge Instructions (Signed)
   Hematuria, Adult Hematuria is blood in your urine. It can be caused by a bladder infection, kidney infection, prostate infection, kidney [name], or cancer of your urinary tract. Infections can usually be treated with medicine, and a kidney [name] usually will pass through your urine. If neither of these is the cause of your hematuria, further workup to find out the reason may be needed. It is very important that you tell your health care provider about any blood you see in your urine, even if the blood stops without treatment or happens without causing pain. Blood in your urine that happens and then stops and then happens again can be a symptom of a very serious condition. Also, pain is not a symptom in the initial stages of many urinary cancers. Follow these instructions at home:  Drink lots of fluid, 3-4 quarts a day. If you have been diagnosed with an infection, cranberry juice is especially recommended, in addition to large amounts of water.  Avoid caffeine, tea, and carbonated beverages because they tend to irritate the bladder.  Avoid alcohol because it may irritate the prostate.  Take all medicines as directed by your health care provider.  If you were prescribed an antibiotic medicine, finish it all even if you start to feel better.  If you have been diagnosed with a kidney [name], follow your health care provider's instructions regarding straining your urine to catch the [name].  Empty your bladder often. Avoid holding urine for long periods of time.  After a bowel movement, women should cleanse front to back. Use each tissue only once.  Empty your bladder before and after sexual intercourse if you are a male. Contact a health care provider if:  You develop back pain.  You have a fever.  You have a feeling of sickness in your stomach (nausea) or vomiting.  Your symptoms are not better in 3 days. Return sooner if you are getting worse. Get help right away if:  You develop  severe vomiting and are unable to keep the medicine down.  You develop severe back or abdominal pain despite taking your medicines.  You begin passing a large amount of blood or clots in your urine.  You feel extremely weak or faint, or you pass out. This information is not intended to replace advice given to you by your health care provider. Make sure you discuss any questions you have with your health care provider. Document Released: 01/09/2005 Document Revised: 06/17/2015 Document Reviewed: 09/09/2012 Elsevier Interactive Patient Education  2017 Elsevier Inc.  

## 2016-03-01 ENCOUNTER — Other Ambulatory Visit: Payer: BC Managed Care – PPO | Admitting: Urology

## 2016-03-23 ENCOUNTER — Ambulatory Visit
Admission: RE | Admit: 2016-03-23 | Discharge: 2016-03-23 | Disposition: A | Discharge status: Home / Self Care | Payer: BC Managed Care – PPO | Source: Ambulatory Visit | Attending: Urology | Admitting: Urology

## 2016-03-23 ENCOUNTER — Other Ambulatory Visit: Payer: Self-pay

## 2016-03-23 DIAGNOSIS — R310 Gross hematuria: Secondary | ICD-10-CM

## 2016-03-23 DIAGNOSIS — K5730 Diverticulosis of large intestine without perforation or abscess without bleeding: Secondary | ICD-10-CM | POA: Diagnosis not present

## 2016-03-23 DIAGNOSIS — I700 Atherosclerosis of aorta: Secondary | ICD-10-CM | POA: Diagnosis not present

## 2016-03-23 DIAGNOSIS — K8020 Calculus of gallbladder without cholecystitis without obstruction: Secondary | ICD-10-CM | POA: Diagnosis not present

## 2016-03-23 DIAGNOSIS — N200 Calculus of kidney: Secondary | ICD-10-CM | POA: Diagnosis not present

## 2016-03-23 LAB — POCT I-STAT CREATININE: Creatinine, Ser: 1 mg/dL (ref 0.61–1.24)

## 2016-03-23 MED ORDER — IOPAMIDOL (ISOVUE-300) INJECTION 61%
125.0000 mL | Freq: Once | INTRAVENOUS | Status: AC | PRN
  Administered 2016-03-23: 125 mL via INTRAVENOUS

## 2016-03-24 ENCOUNTER — Encounter: Payer: Self-pay | Admitting: Urology

## 2016-03-24 ENCOUNTER — Other Ambulatory Visit
Admission: RE | Admit: 2016-03-24 | Discharge: 2016-03-24 | Disposition: A | Discharge status: Home / Self Care | Payer: BC Managed Care – PPO | Source: Ambulatory Visit | Attending: Urology | Admitting: Urology

## 2016-03-24 ENCOUNTER — Ambulatory Visit (INDEPENDENT_AMBULATORY_CARE_PROVIDER_SITE_OTHER): Payer: BC Managed Care – PPO | Admitting: Urology

## 2016-03-24 VITALS — BP 158/92 | HR 96 | Ht 73.0 in | Wt 190.0 lb

## 2016-03-24 DIAGNOSIS — N200 Calculus of kidney: Secondary | ICD-10-CM

## 2016-03-24 DIAGNOSIS — R310 Gross hematuria: Secondary | ICD-10-CM

## 2016-03-24 DIAGNOSIS — N99115 Postprocedural fossa navicularis urethral stricture: Secondary | ICD-10-CM

## 2016-03-24 LAB — URINALYSIS, COMPLETE (UACMP) WITH MICROSCOPIC
BILIRUBIN URINE: NEGATIVE
Bacteria, UA: NONE SEEN
GLUCOSE, UA: NEGATIVE mg/dL
Ketones, ur: NEGATIVE mg/dL
Leukocytes, UA: NEGATIVE
Nitrite: NEGATIVE
PROTEIN: NEGATIVE mg/dL
SQUAMOUS EPITHELIAL / LPF: NONE SEEN
Specific Gravity, Urine: 1.02 (ref 1.005–1.030)
pH: 6 (ref 5.0–8.0)

## 2016-03-24 MED ORDER — LIDOCAINE HCL 2 % EX GEL
1.0000 "application " | Freq: Once | CUTANEOUS | Status: AC
  Administered 2016-03-24: 1 via URETHRAL

## 2016-03-24 MED ORDER — CIPROFLOXACIN HCL 500 MG PO TABS
500.0000 mg | ORAL_TABLET | Freq: Once | ORAL | Status: AC
  Administered 2016-03-24: 500 mg via ORAL

## 2016-03-24 NOTE — Progress Notes (Signed)
03/24/2016 3:32 PM   Tim Wright 1955/10/01 [id number]  Referring provider: Lupita Dawn, MD No address on file  No chief complaint on file.   HPI: 61 year old male who presents today for follow-up after her recent admission in December 2017 for an episode of gross hematuria. He developed right flank pain with right upper tract clot as well as clot retention. He was managed with CBI and his color was ultimately cleared.  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  At the time of bleeding, he was taking an herbal supplement which may have been interacting with his Coumadin.  Initial CT scan on 01/09/2016 showed dense right collecting system [place] branch with a hematoma within the bladder. There is a 4 mm nonobstructing right renal stone, otherwise no pathology was identified. Scan was obscured by clot.  Follow-up CT urogram shows a 5 mm nonobstructing calculus in the right lower pole.  Otherwise, there is no underlying masses or filling defects.  Bladder hematoma has resolved.  Following discharge, he had no further hematuria.  He does report that his urinary stream has become weaker since discharge. He also has some mild spraying of his stream. He does feel like he is able to empty without frequency or urgency.  During the hospitalization, he did have a very large [address] [place] three-way hematuria Foley catheter.  No flank pain or previous stone episodes.    PMH: No past medical history on file.  Surgical History: Past Surgical History:  Procedure Laterality Date  . AORTIC VALVE REPLACEMENT (AVR)/CORONARY ARTERY BYPASS GRAFTING (CABG)      Home Medications:  Allergies as of 03/24/2016   No Known Allergies     Medication List       Accurate as of 03/24/16  3:32 PM. Always use your most recent med list.          enoxaparin 80 MG/0.8ML injection Commonly known as:  LOVENOX Inject 0.9 mLs (90 mg total) into the skin daily.   HYDROcodone-acetaminophen 5-325 MG  tablet Commonly known as:  NORCO/VICODIN Take 2 tablets by mouth every 4 (four) hours as needed for moderate pain.   multivitamin with minerals Tabs tablet Take 1 tablet by mouth daily.   tamsulosin 0.4 MG Caps capsule Commonly known as:  FLOMAX Take 1 capsule (0.4 mg total) by mouth daily.   warfarin 2 MG tablet Commonly known as:  COUMADIN Take 1-2 tablets by mouth as directed. 2 tablets everyday except for tues and thursdays. 1 tablet on tues and Thursday.       Allergies: No Known Allergies  Family History: Family History  Problem Relation Age of Onset  . Diabetes Mellitus II Mother   . CAD Father     Social History:  reports that he has never smoked. He has never used smokeless tobacco. He reports that he does not drink alcohol or use drugs.   Physical Exam: BP (!) 158/92   Pulse 96   Ht 6\' 1"  (1.854 m)   Wt 190 lb (86.2 kg)   BMI 25.07 kg/m   Constitutional:  Alert and oriented, No acute distress. HEENT: [place] AT, moist mucus membranes.  Trachea midline, no masses. Cardiovascular: No clubbing, cyanosis, or edema. Respiratory: Normal respiratory effort, no increased work of breathing. GI: Abdomen is soft, nontender, nondistended, no abdominal masses GU: Normal circumcised phallus. Bilateral descended testicles. Scrotum unremarkable. Skin: No rashes, bruises or suspicious lesions. Lymph: No cervical or inguinal adenopathy. Neurologic: Grossly intact, no focal deficits, moving all  4 extremities. Psychiatric: Normal mood and affect.  Laboratory Data: Lab Results  Component Value Date   WBC 9.0 01/14/2016   HGB 9.3 (L) 01/14/2016   HCT 26.5 (L) 01/14/2016   MCV 95.1 01/14/2016   PLT 180 01/14/2016    Lab Results  Component Value Date   CREATININE 1.00 03/23/2016     Lab Results  Component Value Date   HGBA1C 5.4 01/09/2016    Urinalysis    Component Value Date/Time   COLORURINE YELLOW 03/24/2016 1403   APPEARANCEUR CLEAR 03/24/2016 1403    LABSPEC 1.020 03/24/2016 1403   PHURINE 6.0 03/24/2016 1403   GLUCOSEU NEGATIVE 03/24/2016 1403   HGBUR TRACE (A) 03/24/2016 1403   BILIRUBINUR NEGATIVE 03/24/2016 1403   KETONESUR NEGATIVE 03/24/2016 1403   PROTEINUR NEGATIVE 03/24/2016 1403   NITRITE NEGATIVE 03/24/2016 1403   LEUKOCYTESUR NEGATIVE 03/24/2016 1403    Pertinent Imaging: CLINICAL DATA:  Gross hematuria. Followup right renal collecting system and bladder hemorrhage. Nephrolithiasis.  EXAM: CT ABDOMEN AND PELVIS WITHOUT AND WITH CONTRAST  TECHNIQUE: Multidetector CT imaging of the abdomen and pelvis was performed following the standard protocol before and following the bolus administration of intravenous contrast.  CONTRAST:  125mL ISOVUE-300 IOPAMIDOL (ISOVUE-300) INJECTION 61%  COMPARISON:  01/09/2016  FINDINGS: Lower Chest: No acute findings.  Hepatobiliary: No masses identified. Cholelithiasis again demonstrated, without evidence of cholecystitis or biliary dilatation.  Pancreas:  No mass or inflammatory changes.  Spleen: Within normal limits in size and appearance.  Adrenals/Urinary Tract: Previously seen hemorrhage within the right renal collecting system and urinary bladder is no longer visualized. 5 mm nonobstructive calculus is again seen in the lower pole of the right kidney. No evidence of ureteral calculi or hydronephrosis.  No renal masses identified. No masses seen involving the ureters or urinary bladder.  Stomach/Bowel: No evidence of obstruction, inflammatory process or abnormal fluid collections. Normal appendix visualized. Diverticulosis is seen mainly involving the descending and sigmoid colon, however there is no evidence of diverticulitis.  Vascular/Lymphatic: No pathologically enlarged lymph nodes. No abdominal aortic aneurysm. Aortic atherosclerosis.  Reproductive:  Unremarkable prostate and seminal vesicles.  Other:  None.  Musculoskeletal:  No suspicious  bone lesions identified.  IMPRESSION: 5 mm nonobstructive calculus in lower pole right kidney. No evidence of ureteral calculi or hydronephrosis.  No radiographic evidence of urinary tract neoplasm.  Colonic diverticulosis. No radiographic evidence of diverticulitis.  Cholelithiasis.  No radiographic evidence of cholecystitis.  Aortic atherosclerosis.   Electronically Signed   By: Myles RosenthalJohn  Stahl M.D.   On: 03/23/2016 16:41  CT scan personally reviewed today.  This was compared to previous study of 12/2015.   Cystoscopy Procedure Note  Patient identification was confirmed, informed consent was obtained, and patient was prepped using Betadine solution.  Lidocaine jelly was administered per urethral meatus.    Preoperative abx where received prior to procedure.     Pre-Procedure: - Inspection reveals a normal caliber ureteral meatus.    Procedure: The flexible cystoscope was introduced initially with some difficulty.  A meatal dilator was used to gently dilate a small fossa navicularis stricture in order to accommodate the 16 French flexible scope. - No urethral strictures/lesions are present. - Normal prostate  - Normal bladder neck - Bilateral ureteral orifices identified - Bladder mucosa  reveals no ulcers, tumors, or lesions - No bladder stones - No trabeculation  Retroflexion unremarkable   Post-Procedure: - Patient tolerated the procedure well   Assessment & Plan:   1. Gross hematuria Etiology  of spontaneous probable right upper tract bleed with clot retention unclear, in the setting of supratheraputic Follow-up imaging with a repeat CT urogram negative for any underlying malignancies, no filling defects or masses appreciated Cystoscopy today was unremarkable - ciprofloxacin (CIPRO) tablet 500 mg; Take 1 tablet (500 mg total) by mouth once. - lidocaine (XYLOCAINE) 2 % jelly 1 application; Place 1 application into the urethra once.  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 Right nephrolithiasis Asymptomatic right 5 mm lower pole stone We'll continue to follow, KUB in 1 year to assess for growth  Return in about 1 year (around 03/24/2017) for Uroflow, KUB, UA.  Vanna Scotland, MD  [facility] [address] [place], [place] [address] [phone]

## 2017-03-22 ENCOUNTER — Other Ambulatory Visit: Payer: Self-pay

## 2017-03-22 DIAGNOSIS — N200 Calculus of kidney: Secondary | ICD-10-CM

## 2017-03-23 ENCOUNTER — Ambulatory Visit: Payer: BC Managed Care – PPO | Admitting: Urology

## 2017-03-23 ENCOUNTER — Encounter: Payer: Self-pay | Admitting: Urology

## 2017-08-26 IMAGING — CT CT ABD-PEL WO/W CM
3 of 11 series · 11 of 46 positions shown, 17 images · IV contrast (APPLIED)
Comparison: None.

CLINICAL DATA: Gross hematuria, severe pelvic pain.  On Coumadin.

EXAM:
CT ABDOMEN AND PELVIS WITHOUT AND WITH CONTRAST
TECHNIQUE: Multidetector CT imaging of the abdomen and pelvis was performed
following the standard protocol before and following the bolus
administration of intravenous contrast.
CONTRAST:  125mL OYBKQS-OOO IOPAMIDOL (OYBKQS-OOO) INJECTION 61%

[Series 2: axial pre · axial · non-contrast · 0.74mm/px · z∈[-443,-58]mm · 7 of 103 slices shown, 12 images]
[im 13/103  soft-tissue]
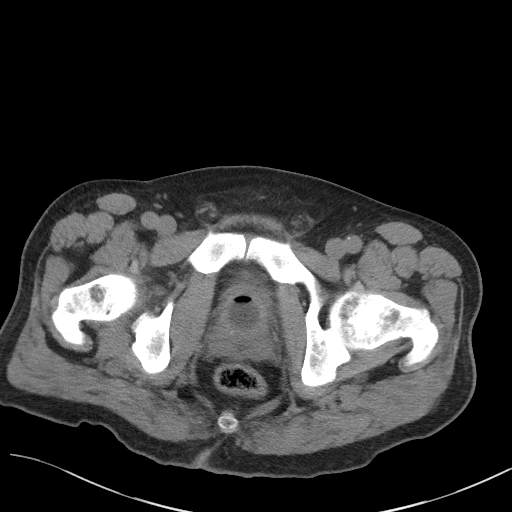
[im 13/103  bone]
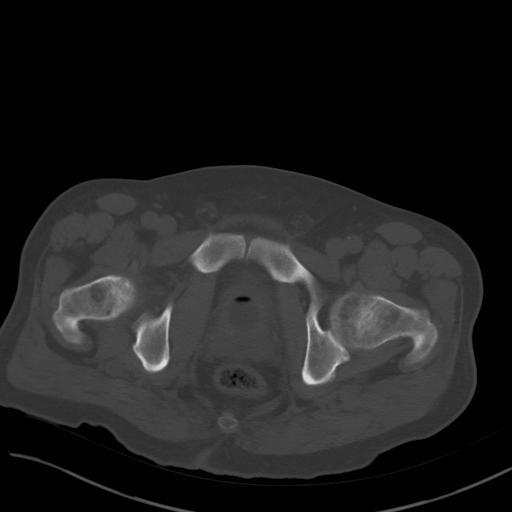
[im 26/103  soft-tissue]
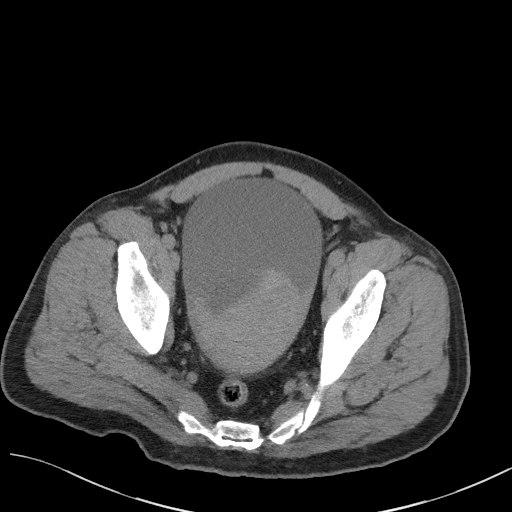
[im 39/103  soft-tissue]
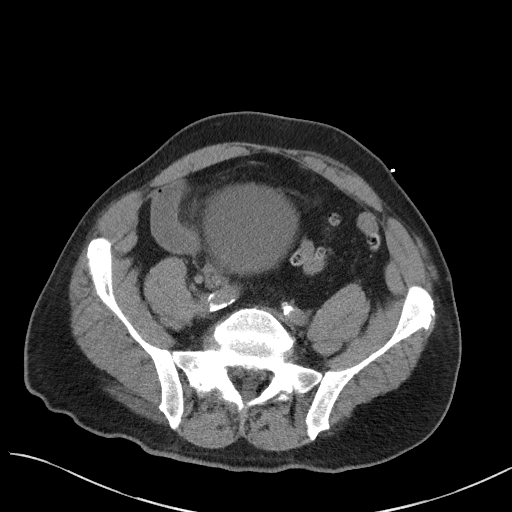
[im 52/103  soft-tissue]
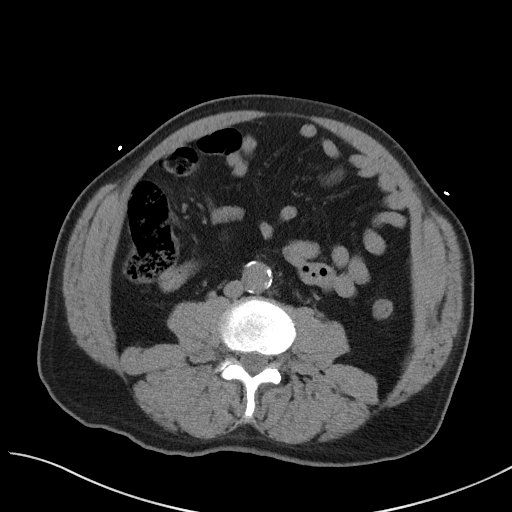
[im 52/103  lung]
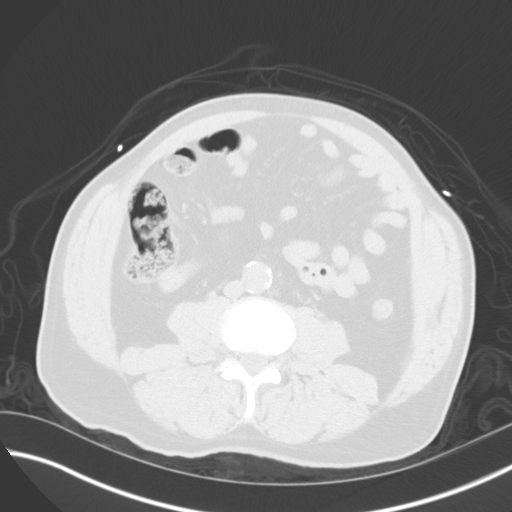
[im 64/103  soft-tissue]
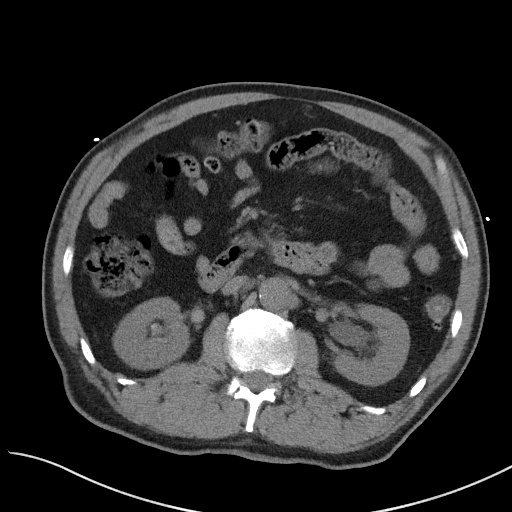
[im 64/103  lung]
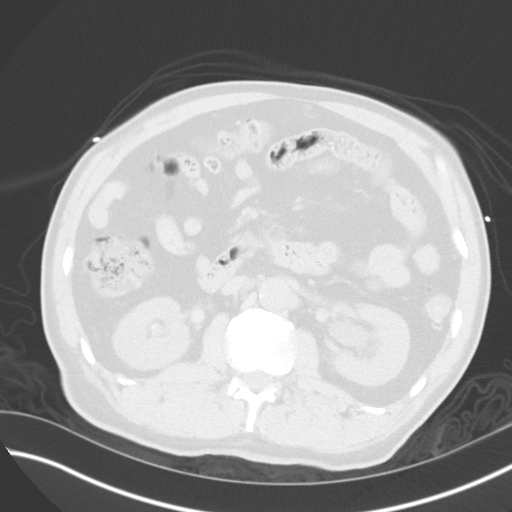
[im 77/103  soft-tissue]
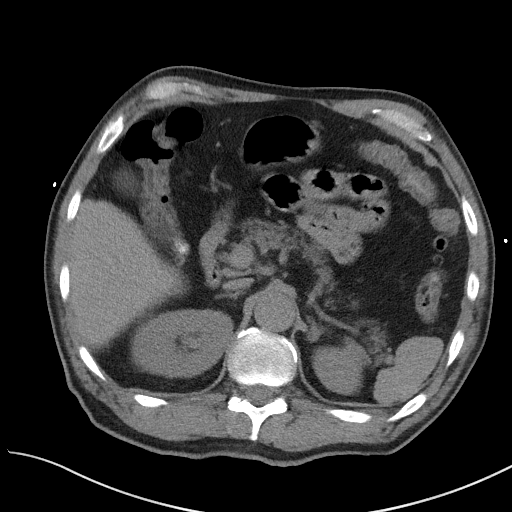
[im 77/103  lung]
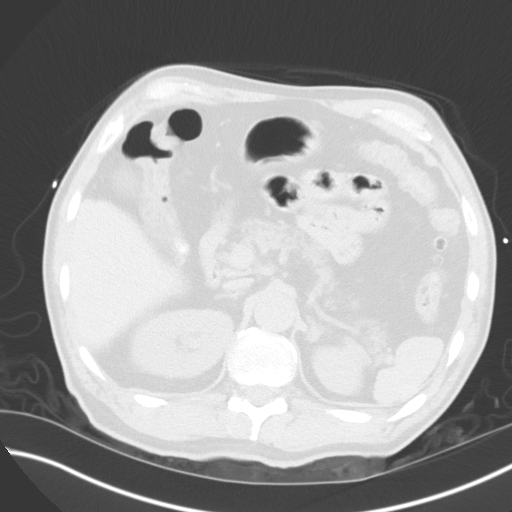
[im 90/103  soft-tissue]
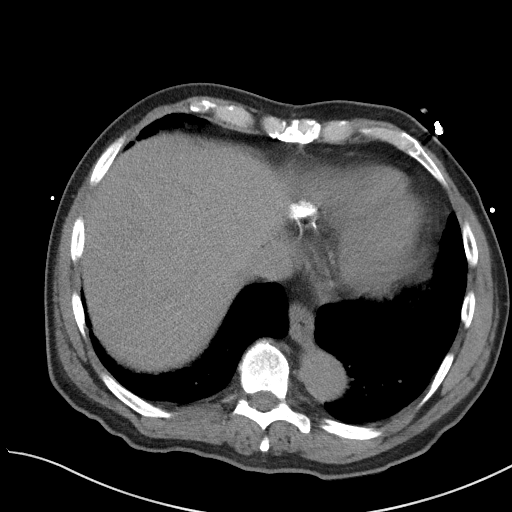
[im 90/103  lung]
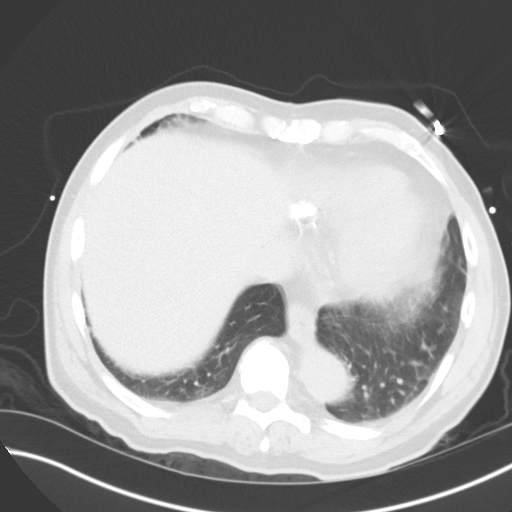

[Series 5: axial post · axial · 0.74mm/px · z∈[-443,-378]mm · 2 of 103 slices shown]
[im 13/103  soft-tissue]
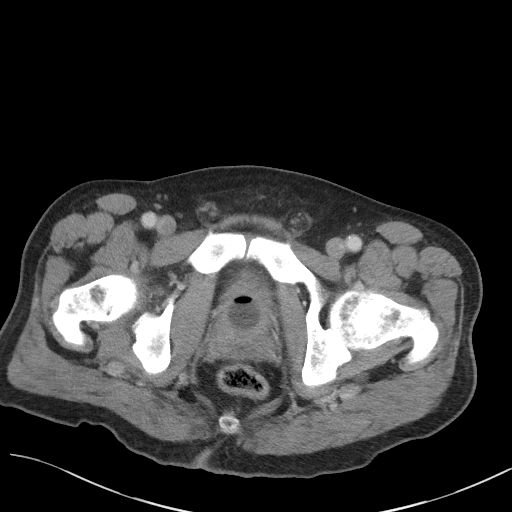
[im 26/103  soft-tissue]
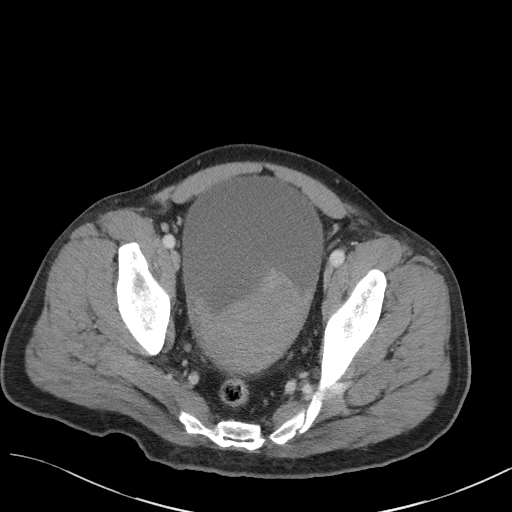

[Series 7: coronal pre · coronal · non-contrast · 0.67mm/px · 2 of 97 slices shown, 3 images]
[im 33/97  soft-tissue]
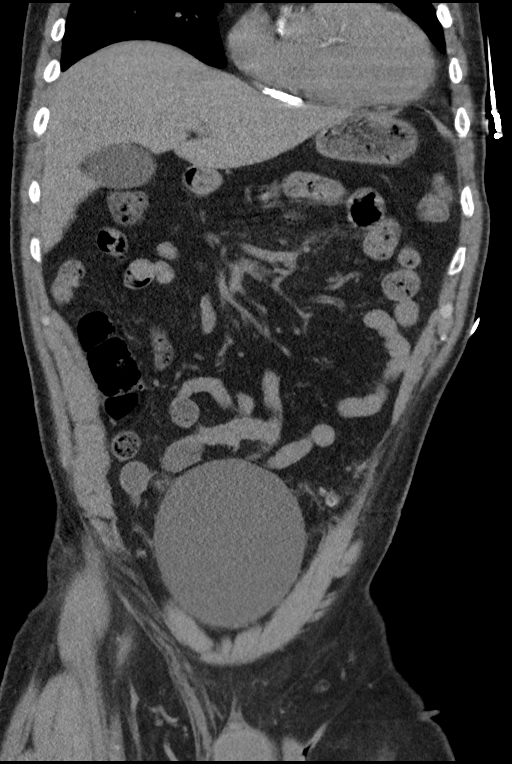
[im 33/97  bone]
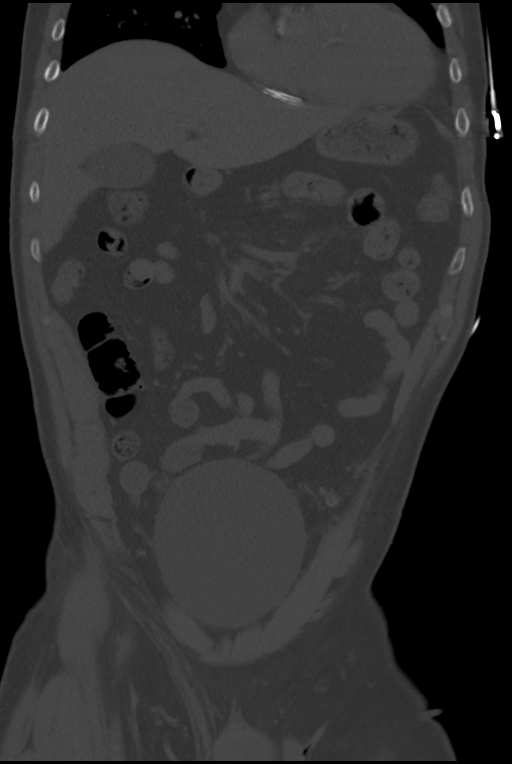
[im 65/97  soft-tissue]
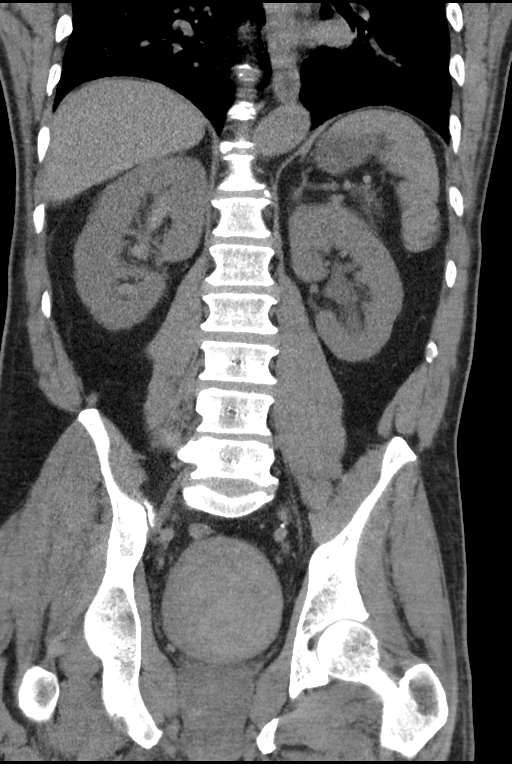

[11 of 46 positions shown; findings below may reference images not displayed]

FINDINGS: LOWER CHEST: Minimal atelectasis. Ectatic versus aneurysmal included
aorta with heavily calcified aortic root versus bowel, status post
median sternotomy. The heart is mildly enlarged. No pericardial
effusions. Mild coronary artery calcifications.

HEPATOBILIARY: Liver is normal. Multiple subcentimeter gallstones
without CT findings of acute cholecystitis.

PANCREAS: Normal.

SPLEEN: Normal.

ADRENALS/URINARY TRACT: Kidneys are orthotopic, demonstrating
symmetric enhancement. Intrinsically dense RIGHT urinary collecting
system. 4 mm RIGHT lower pole nephrolithiasis. Mild bilateral
hydroureteronephrosis. No ureteral calculi. Delayed RIGHT
nephrogram. Too small to characterize hypodensities kidneys
bilaterally. No mass. Urinary bladder is very distended with
dependent rounded density consistent with blood products. No
enhancing masses within the ureter ears or urinary bladder. Foley
catheter and retaining bulb within the bladder which is very
distended.

STOMACH/BOWEL: The stomach, small and large bowel are normal in
course and caliber without inflammatory changes. Severe LEFT:
Diverticulosis. Small air-filled duodenum diverticulum. Normal
appendix.

VASCULAR/LYMPHATIC: Aortoiliac vessels are normal in course and
caliber, moderate calcific atherosclerosis. No lymphadenopathy by CT
size criteria.

REPRODUCTIVE: Normal.

OTHER: No intraperitoneal free fluid or free air.

MUSCULOSKELETAL: Nonacute. Small fat containing umbilical hernias.
Developmentally unfused L5 spinous process. Grade 1 L5-S1
anterolisthesis, chronic RIGHT L5 pars interarticularis defect.
IMPRESSION: Dense RIGHT collecting system compatible with hemorrhage. Hematoma
within distended urinary bladder, containing Foley catheter.

RIGHT renal dysfunction. Mild bilateral hydroureteronephrosis. 4 mm
RIGHT lower pole nephrolithiasis.

Severe LEFT colon diverticulosis without diverticulitis.

## 2018-02-14 ENCOUNTER — Ambulatory Visit: Payer: BC Managed Care – PPO | Admitting: Urology

## 2018-03-22 ENCOUNTER — Ambulatory Visit: Payer: Self-pay | Admitting: Urology

## 2018-04-15 ENCOUNTER — Telehealth: Payer: Self-pay

## 2018-04-15 NOTE — Telephone Encounter (Signed)
Per Dr. Apolinar Junes patient had appointment for hematuria follow up on 04-18-18. Patient states he is having no urinary symptoms and is not experiencing gross hematuria. Due to the CoVid19 precautions patient verbalized agreement for appointment reschedule 3 months

## 2018-04-19 ENCOUNTER — Ambulatory Visit: Payer: Self-pay | Admitting: Urology

## 2018-07-19 ENCOUNTER — Ambulatory Visit: Payer: Self-pay | Admitting: Urology

## 2018-08-13 ENCOUNTER — Other Ambulatory Visit: Payer: Self-pay

## 2018-08-13 DIAGNOSIS — R319 Hematuria, unspecified: Secondary | ICD-10-CM

## 2018-08-16 ENCOUNTER — Ambulatory Visit: Payer: Self-pay | Admitting: Urology

## 2018-09-18 ENCOUNTER — Ambulatory Visit: Payer: BC Managed Care – PPO | Admitting: Urology

## 2018-09-19 ENCOUNTER — Encounter: Payer: Self-pay | Admitting: Urology

## 2022-03-14 DIAGNOSIS — H9190 Unspecified hearing loss, unspecified ear: Secondary | ICD-10-CM | POA: Insufficient documentation

## 2022-04-13 DIAGNOSIS — I10 Essential (primary) hypertension: Secondary | ICD-10-CM | POA: Diagnosis present

## 2022-08-27 ENCOUNTER — Other Ambulatory Visit: Payer: Self-pay

## 2022-08-27 ENCOUNTER — Observation Stay
Admission: EM | Admit: 2022-08-27 | Discharge: 2022-08-28 | Disposition: A | Discharge status: Home / Self Care | Payer: BC Managed Care – PPO | Attending: Internal Medicine | Admitting: Internal Medicine

## 2022-08-27 ENCOUNTER — Emergency Department: Payer: BC Managed Care – PPO

## 2022-08-27 DIAGNOSIS — I10 Essential (primary) hypertension: Secondary | ICD-10-CM | POA: Diagnosis not present

## 2022-08-27 DIAGNOSIS — Z952 Presence of prosthetic heart valve: Secondary | ICD-10-CM | POA: Diagnosis not present

## 2022-08-27 DIAGNOSIS — Z79899 Other long term (current) drug therapy: Secondary | ICD-10-CM | POA: Diagnosis not present

## 2022-08-27 DIAGNOSIS — I639 Cerebral infarction, unspecified: Secondary | ICD-10-CM | POA: Diagnosis not present

## 2022-08-27 DIAGNOSIS — Z7901 Long term (current) use of anticoagulants: Secondary | ICD-10-CM | POA: Diagnosis not present

## 2022-08-27 DIAGNOSIS — R4781 Slurred speech: Secondary | ICD-10-CM | POA: Diagnosis present

## 2022-08-27 LAB — DIFFERENTIAL
Abs Immature Granulocytes: 0.02 10*3/uL (ref 0.00–0.07)
Basophils Absolute: 0 10*3/uL (ref 0.0–0.1)
Basophils Relative: 0 %
Eosinophils Absolute: 0.2 10*3/uL (ref 0.0–0.5)
Eosinophils Relative: 2 %
Immature Granulocytes: 0 %
Lymphocytes Relative: 16 %
Lymphs Abs: 1.1 10*3/uL (ref 0.7–4.0)
Monocytes Absolute: 0.9 10*3/uL (ref 0.1–1.0)
Monocytes Relative: 13 %
Neutro Abs: 4.8 10*3/uL (ref 1.7–7.7)
Neutrophils Relative %: 69 %

## 2022-08-27 LAB — HEMOGLOBIN A1C
Hgb A1c MFr Bld: 5.7 % — ABNORMAL HIGH (ref 4.8–5.6)
Mean Plasma Glucose: 116.89 mg/dL

## 2022-08-27 LAB — APTT: aPTT: 41 seconds — ABNORMAL HIGH (ref 24–36)

## 2022-08-27 LAB — COMPREHENSIVE METABOLIC PANEL
ALT: 21 U/L (ref 0–44)
AST: 29 U/L (ref 15–41)
Albumin: 4.6 g/dL (ref 3.5–5.0)
Alkaline Phosphatase: 131 U/L — ABNORMAL HIGH (ref 38–126)
Anion gap: 11 (ref 5–15)
BUN: 13 mg/dL (ref 8–23)
CO2: 25 mmol/L (ref 22–32)
Calcium: 9.6 mg/dL (ref 8.9–10.3)
Chloride: 104 mmol/L (ref 98–111)
Creatinine, Ser: 1.04 mg/dL (ref 0.61–1.24)
GFR, Estimated: >60 mL/min (ref >60)
Glucose, Bld: 120 mg/dL — ABNORMAL HIGH (ref 70–99)
Potassium: 4.1 mmol/L (ref 3.5–5.1)
Sodium: 140 mmol/L (ref 135–145)
Total Bilirubin: 0.5 mg/dL (ref 0.3–1.2)
Total Protein: 8.1 g/dL (ref 6.5–8.1)

## 2022-08-27 LAB — LIPID PANEL
Cholesterol: 211 mg/dL — ABNORMAL HIGH (ref 0–200)
HDL: 37 mg/dL — ABNORMAL LOW (ref >40)
LDL Cholesterol: 143 mg/dL — ABNORMAL HIGH (ref 0–99)
Total CHOL/HDL Ratio: 5.7 RATIO
Triglycerides: 155 mg/dL — ABNORMAL HIGH (ref <150)
VLDL: 31 mg/dL (ref 0–40)

## 2022-08-27 LAB — PROTIME-INR
INR: 2 — ABNORMAL HIGH (ref 0.8–1.2)
Prothrombin Time: 22.6 seconds — ABNORMAL HIGH (ref 11.4–15.2)

## 2022-08-27 LAB — CBG MONITORING, ED: Glucose-Capillary: 125 mg/dL — ABNORMAL HIGH (ref 70–99)

## 2022-08-27 LAB — CBC
HCT: 43.5 % (ref 39.0–52.0)
Hemoglobin: 15.1 g/dL (ref 13.0–17.0)
MCH: 32.7 pg (ref 26.0–34.0)
MCHC: 34.7 g/dL (ref 30.0–36.0)
MCV: 94.2 fL (ref 80.0–100.0)
Platelets: 236 10*3/uL (ref 150–400)
RBC: 4.62 MIL/uL (ref 4.22–5.81)
RDW: 12.6 % (ref 11.5–15.5)
WBC: 7 10*3/uL (ref 4.0–10.5)
nRBC: 0 % (ref 0.0–0.2)

## 2022-08-27 LAB — ETHANOL: Alcohol, Ethyl (B): <10 mg/dL (ref <10)

## 2022-08-27 MED ORDER — ADULT MULTIVITAMIN W/MINERALS CH
1.0000 | ORAL_TABLET | Freq: Every day | ORAL | Status: DC
  Administered 2022-08-27 – 2022-08-28 (×2): 1 via ORAL
  Filled 2022-08-27 (×2): qty 1

## 2022-08-27 MED ORDER — STROKE: EARLY STAGES OF RECOVERY BOOK: Freq: Once | Status: AC

## 2022-08-27 MED ORDER — IOHEXOL 350 MG/ML SOLN
75.0000 mL | Freq: Once | INTRAVENOUS | Status: AC | PRN
  Administered 2022-08-27: 75 mL via INTRAVENOUS

## 2022-08-27 MED ORDER — WARFARIN SODIUM 4 MG PO TABS
4.0000 mg | ORAL_TABLET | Freq: Once | ORAL | Status: AC
  Administered 2022-08-27: 4 mg via ORAL
  Filled 2022-08-27 (×2): qty 1

## 2022-08-27 MED ORDER — VITAMIN C 500 MG PO TABS
500.0000 mg | ORAL_TABLET | Freq: Every day | ORAL | Status: DC
  Administered 2022-08-28: 500 mg via ORAL
  Filled 2022-08-27 (×2): qty 1

## 2022-08-27 MED ORDER — VITAMIN B-12 1000 MCG PO TABS
1000.0000 ug | ORAL_TABLET | Freq: Every day | ORAL | Status: DC
  Administered 2022-08-28: 1000 ug via ORAL
  Filled 2022-08-27: qty 1

## 2022-08-27 MED ORDER — SENNOSIDES-DOCUSATE SODIUM 8.6-50 MG PO TABS: 1.0000 | ORAL_TABLET | Freq: Every evening | ORAL | Status: DC | PRN

## 2022-08-27 MED ORDER — ACETAMINOPHEN 325 MG PO TABS
650.0000 mg | ORAL_TABLET | ORAL | Status: DC | PRN
  Administered 2022-08-28: 650 mg via ORAL

## 2022-08-27 MED ORDER — ACETAMINOPHEN 160 MG/5ML PO SOLN: 650.0000 mg | ORAL | Status: DC | PRN

## 2022-08-27 MED ORDER — ACETAMINOPHEN 650 MG RE SUPP: 650.0000 mg | RECTAL | Status: DC | PRN

## 2022-08-27 MED ORDER — ATORVASTATIN CALCIUM 20 MG PO TABS
40.0000 mg | ORAL_TABLET | Freq: Every day | ORAL | Status: DC
  Administered 2022-08-27 – 2022-08-28 (×2): 40 mg via ORAL
  Filled 2022-08-27 (×2): qty 2

## 2022-08-27 MED ORDER — WARFARIN - PHARMACIST DOSING INPATIENT
Freq: Every day | Status: DC
  Filled 2022-08-27: qty 1

## 2022-08-27 NOTE — Consult Note (Signed)
Modified Rankin score 0 1435 code stroke cart activated 1440 Dr Selina Cooley paged 1441 patient to CT 1442 Dr Selina Cooley at bedside in CT

## 2022-08-27 NOTE — Progress Notes (Signed)
CODE STROKE- PHARMACY COMMUNICATION   Time CODE STROKE called/page received: 1435  Time response to CODE STROKE was made (in person or via phone): in person   Time Stroke Kit retrieved from Pyxis (only if needed): N/A - not given due to INR > 1.7  Name of Provider/Nurse contacted: Bing Neighbors  History reviewed. No pertinent past medical history. Prior to Admission medications   Medication Sig Start Date End Date Taking? Authorizing Provider  enoxaparin (LOVENOX) 80 MG/0.8ML injection Inject 0.9 mLs (90 mg total) into the skin daily. 01/14/16 01/17/16  Houston Siren, MD  HYDROcodone-acetaminophen (NORCO/VICODIN) 5-325 MG tablet Take 2 tablets by mouth every 4 (four) hours as needed for moderate pain. 01/14/16   Houston Siren, MD  Multiple Vitamin (MULTIVITAMIN WITH MINERALS) TABS tablet Take 1 tablet by mouth daily.    [provider]  tamsulosin (FLOMAX) 0.4 MG CAPS capsule Take 1 capsule (0.4 mg total) by mouth daily. 01/15/16   Houston Siren, MD  warfarin (COUMADIN) 2 MG tablet Take 1-2 tablets by mouth as directed. 2 tablets everyday except for tues and thursdays. 1 tablet on tues and Thursday. 12/20/15   [provider]   Rockwell Alexandria, PharmD Clinical Pharmacist 08/27/2022 3:17 PM

## 2022-08-27 NOTE — Consult Note (Signed)
NEUROLOGY CONSULTATION NOTE   Date of service: August 27, 2022 Patient Name: Tim Wright MRN:  [id number] DOB:  1955/10/07 Reason for consult: stroke code Requesting physician: Dr. 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 He had an episode earlier today when he noticed he was mixing up his words while he was talking to himself and then called his daughter who said his speech was slurred at approx 1300. He came to the ED and had no deficits until 1430 when he again developed moderate dysarthria and mild aphasia. Stroke code was called. CT head showed no acute process. INR was 1.9 therefore TNK was unable to be considered. CTA showed no LVO. All CNS imaging was personally reviewed.    ROS   Per HPI: all other systems reviewed and are negative  Past History   I have reviewed the following:  History reviewed. No pertinent past medical history. Past Surgical History:  Procedure Laterality Date   AORTIC VALVE REPLACEMENT (AVR)/CORONARY ARTERY BYPASS GRAFTING (CABG)     Family History  Problem Relation Age of Onset   Diabetes Mellitus II Mother    CAD Father    Social History   Socioeconomic History   Marital status: Married    Spouse name: Not on file   Number of children: Not on file   Years of education: Not on file   Highest education level: Not on file  Occupational History   Not on file  Tobacco Use   Smoking status: Never   Smokeless tobacco: Never  Substance and Sexual Activity   Alcohol use: No   Drug use: No   Sexual activity: Yes    Partners: Female  Other Topics Concern   Not on file  Social History Narrative   Not on file   Social Determinants of Health   Financial Resource Strain: Low Risk  (09/08/2021)   Received from [facility]   Overall Financial Resource Strain (CARDIA)    Difficulty of Paying Living Expenses: Not hard at all  Food Insecurity: No Food Insecurity (09/08/2021)   Received from [facility]   Hunger Vital Sign    Worried About Running Out of Food in the Last Year: Never true    Ran Out of Food in the Last Year: Never true  Transportation Needs: No Transportation Needs (06/06/2021)   Received from [facility]   PRAPARE - Transportation    Lack of Transportation (Medical): No    Lack of Transportation (Non-Medical): No  Physical Activity: Sufficiently Active (03/14/2022)   Received from [facility]   Exercise Vital Sign    Days of Exercise per Week: 5 days    Minutes of Exercise per Session: 50 min  Stress: No Stress Concern Present (03/14/2022)   Received from [facility] - Occupational Stress Questionnaire    Feeling of Stress : Not at all  Social Connections: Not on file   No Known Allergies  Medications   (Not in a hospital admission)     Current Facility-Administered Medications:    [START ON 08/28/2022]  stroke: early stages of recovery book, ,  Does not apply, Once, Verdene Lennert, MD   acetaminophen (TYLENOL) tablet 650 mg, 650 mg, Oral, Q4H PRN **OR** acetaminophen (TYLENOL) 160 MG/5ML solution 650 mg, 650 mg, Per Tube, Q4H PRN **OR** acetaminophen (TYLENOL) suppository 650 mg, 650 mg, Rectal, Q4H PRN, Verdene Lennert, MD   ascorbic acid (VITAMIN C) tablet 500 mg, 500 mg, Oral, Daily, Verdene Lennert, MD   [START ON 08/28/2022] cyanocobalamin (VITAMIN B12) tablet 1,000 mcg, 1,000 mcg, Oral, Daily, Verdene Lennert, MD   multivitamin with minerals tablet 1 tablet, 1 tablet, Oral, Daily, Verdene Lennert, MD   senna-docusate (Senokot-S) tablet 1 tablet, 1 tablet, Oral, QHS PRN, Verdene Lennert, MD   warfarin (COUMADIN) tablet 4 mg, 4 mg, Oral, Once, Rockwell Alexandria, [facility]   [START ON 08/28/2022] Warfarin - Pharmacist Dosing  Inpatient, , Does not apply, q1600, Rockwell Alexandria, [facility]  Current Outpatient Medications:    amLODipine (NORVASC) 5 MG tablet, Take 1 tablet by mouth every evening., Disp: , Rfl:    ascorbic acid (VITAMIN C) 500 MG tablet, Take 500 mg by mouth daily., Disp: , Rfl:    cyanocobalamin (VITAMIN B12) 1000 MCG tablet, Take 1,000 mcg by mouth daily., Disp: , Rfl:    HYDROcodone-acetaminophen (NORCO/VICODIN) 5-325 MG tablet, Take 2 tablets by mouth every 4 (four) hours as needed for moderate pain. (Patient taking differently: Take 1 tablet by mouth every 6 (six) hours as needed for moderate pain.), Disp: 30 tablet, Rfl: 0   Multiple Vitamin (MULTIVITAMIN WITH MINERALS) TABS tablet, Take 1 tablet by mouth daily., Disp: , Rfl:    warfarin (COUMADIN) 2 MG tablet, Take 2-4 mg by mouth See admin instructions. Take 1 tablet (2mg ) by mouth every Monday, Wednesday and Friday evening and take 2 tablets (4mg ) by mouth every Tuesday, Thursday, Saturday and Sunday evening, Disp: , Rfl: 1   zinc sulfate 220 (50 Zn) MG capsule, Take 220 mg by mouth daily., Disp: , Rfl:   Vitals   Vitals:   08/27/22 1432 08/27/22 1433  BP: (!) 165/87   Pulse: 94   Resp: 19   Temp: 98.1 F (36.7 C)   TempSrc: Oral   SpO2: 95%   Weight:  90.3 kg  Height:  6\' 1"  (1.854 m)     Body mass index is 26.25 kg/m.  Physical Exam   Physical Exam Gen: A&O x4, NAD HEENT: Atraumatic, normocephalic;mucous membranes moist; oropharynx clear, tongue without atrophy or fasciculations. Neck: Supple, trachea midline. Resp: CTAB, no w/r/r CV: RRR, no m/g/r; nml S1 and S2. 2+ symmetric peripheral pulses. Abd: soft/NT/ND; nabs x 4 quad Extrem: Nml bulk; no cyanosis, clubbing, or edema.  Neuro: *MS: A&O x4. Follows single commands.  *Speech: moderate dysarthria, able to name 6/6 objects but has word finding difficulty in conversation *CN:    I: Deferred   II,III: PERRLA, VFF by confrontation, optic discs unable to be visualized 2/2  pupillary constriction   III,IV,VI: EOMI w/o nystagmus, no ptosis   V: Sensation intact from V1 to V3 to LT   VII: Eyelid closure was full.  Smile symmetric.   VIII: Hearing intact to voice   IX,X: Voice normal, palate elevates symmetrically    XI: SCM/trap 5/5 bilat   XII: Tongue protrudes midline, no atrophy or fasciculations   *Motor:   Normal bulk.  No tremor, rigidity or bradykinesia. No pronator drift.    Strength: Dlt Bic Tri WrE WrF FgS Gr HF KnF KnE PlF DoF    Left 5 5 5 5 5 5 5  5  5 5 5 5     Right 5 5 5 5 5 5 5 5 5 5 5 5     *Sensory: Intact to light touch, pinprick, temperature vibration throughout. Symmetric. Propioception intact bilat.  No double-simultaneous extinction.  *Coordination:  Finger-to-nose, heel-to-shin, rapid alternating motions were intact. *Reflexes:  2+ and symmetric throughout without clonus; toes down-going bilat *Gait: normal base, normal stride, normal turn. Negative Romberg.  NIHSS = 2 for aphasia and dysarthria   Premorbid mRS = 0   Labs   CBC:  Recent Labs  Lab 08/27/22 1438  WBC 7.0  NEUTROABS 4.8  HGB 15.1  HCT 43.5  MCV 94.2  PLT 236    Basic Metabolic Panel:  Lab Results  Component Value Date   NA 140 08/27/2022   K 4.1 08/27/2022   CO2 25 08/27/2022   GLUCOSE 120 (H) 08/27/2022   BUN 13 08/27/2022   CREATININE 1.04 08/27/2022   CALCIUM 9.6 08/27/2022   GFRNONAA >60 08/27/2022   GFRAA >60 01/12/2016   Lipid Panel:  Lab Results  Component Value Date   LDLCALC 143 (H) 08/27/2022   HgbA1c:  Lab Results  Component Value Date   HGBA1C 5.4 01/09/2016   Urine Drug Screen: No results found for: "LABOPIA", "COCAINSCRNUR", "LABBENZ", "AMPHETMU", "THCU", "LABBARB"  Alcohol Level     Component Value Date/Time   ETH <10 08/27/2022 1438    CT Head without contrast: No acute process, personal review  CT angio Head and Neck with contrast: No LVO, personal review   Impression   This is a 67 yo man with hx mechanical  aortic valve on warfarin with INR goal 2-3, HTN, HL, ascending aortic aneurysm without rupture, who presents with acute onset dysarthria and aphasia after a transient episode of same earlier today that had resolved. NIHSS = 2. CT/CTA showed no acute process. INR was 1.9 therefore TNK was unable to be considered. CTA showed no LVO.   Recommendations   - Admit for stroke workup - Permissive HTN x48 hrs from sx onset or until stroke ruled out by MRI goal BP <220/110. PRN labetalol or hydralazine if BP above these parameters. Avoid oral antihypertensives. - MRI brain wo contrast - TTE  - Check A1c and LDL + add statin per guidelines - Continue warfarin for INR goal 2-3 (ideally closer to 2 in the setting of acute ischemic stroke, although being in therapeutic window for INR is critical to avoid further ischemic events in the setting of mechanical valve). No neurologic indication for antiplatelets in addition to therapeutic anticoagulation with warfarin. - q4 hr neuro checks - STAT head CT for any change in neuro exam - Tele - PT/OT/SLP - Stroke education - Amb referral to neurology upon discharge   Neurology will continue to follow  Addendum 1900: Patient refuses MRI unless he is given general anesthesia. Will instead repeat head CT at 24 hours.  ______________________________________________________________________   Thank you for the opportunity to take part in the care of this patient. If you have any further questions, please contact the neurology consultation attending.  Signed,  Bing Neighbors, MD [facility] [phone]  If 7pm- 7am, please page neurology on call as listed in AMION.  **Any copied and pasted documentation in this note was written by me in another application not billed for and pasted by me into this document.

## 2022-08-27 NOTE — H&P (Signed)
History and Physical    Patient: Tim Wright KGM:[id number] DOB: [birth year] DOA: 08/27/2022 DOS: the patient was seen and examined on 08/27/2022 PCP: Lupita Dawn, MD  Patient coming from: Home  Chief Complaint:  Chief Complaint  Patient presents with   Transient Ischemic Attack        HPI: Tim Wright is a 67 y.o. 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  Mr. Litsey states that he was in his usual state of health today around noon when he noticed that he was unable to get any words out when he was trying to call his grandson's dog.  He noted that symptoms went on for a bit and so he went and took a shower.  After taking a shower, his symptoms seem to have improved but then he developed slurred speech.  His family told him he had left-sided facial droop.  He denies any focal extremity weakness, palpitations, chest pain, shortness of breath, nausea, vomiting, diarrhea.  He denies any prior history of stroke.  He notes that he has severe claustrophobia and has required general anesthesia in the past for MRI.  ED course: On arrival to the ED, patient was hypertensive at 165/87 with heart rate of 94.  He was saturating at 95% on room air.  He was afebrile at 98.1. Initial workup notable for normal CBC, and CMP with glucose of 120, and alkaline phosphatase of 131 but otherwise normal electrolytes and renal function.  INR 2.0 and PTT 41.  CT of the head was obtained that demonstrated possible hyperdense left MCA with no evidence of LVO seen on CTA.  Neurology consulted.  [facility] contacted for admission.  Review of Systems: As mentioned in the history of present illness. All other systems reviewed and are negative.  History reviewed. No pertinent past medical history.  Past  Surgical History:  Procedure Laterality Date   AORTIC VALVE REPLACEMENT (AVR)/CORONARY ARTERY BYPASS GRAFTING (CABG)     Social History:  reports that he has never smoked. He has never used smokeless tobacco. He reports that he does not drink alcohol and does not use drugs.  No Known Allergies  Family History  Problem Relation Age of Onset   Diabetes Mellitus II Mother    CAD Father     Prior to Admission medications   Medication Sig Start Date End Date Taking? Authorizing Provider  enoxaparin (LOVENOX) 80 MG/0.8ML injection Inject 0.9 mLs (90 mg total) into the skin daily. 01/14/16 01/17/16  Houston Siren, MD  HYDROcodone-acetaminophen (NORCO/VICODIN) 5-325 MG tablet Take 2 tablets by mouth every 4 (four) hours as needed for moderate pain. 01/14/16   Houston Siren, MD  Multiple Vitamin (MULTIVITAMIN WITH MINERALS) TABS tablet Take 1 tablet by mouth daily.    [provider]  tamsulosin (FLOMAX) 0.4 MG CAPS capsule Take 1 capsule (0.4 mg total) by mouth daily. 01/15/16   Houston Siren, MD  warfarin (COUMADIN) 2 MG tablet Take 1-2 tablets by mouth as directed. 2 tablets everyday except for tues and thursdays. 1 tablet on tues and Thursday. 12/20/15   [provider]    Physical Exam: Vitals:   08/27/22 1432 08/27/22 1433  BP: (!) 165/87   Pulse: 94   Resp: 19   Temp: 98.1 F (36.7 C)   TempSrc: Oral  SpO2: 95%   Weight:  90.3 kg  Height:  6\' 1"  (1.854 m)   Physical Exam Vitals and nursing note reviewed.  Constitutional:      General: He is not in acute distress.    Appearance: He is normal weight.  HENT:     Head: Normocephalic and atraumatic.     Mouth/Throat:     Mouth: Mucous membranes are moist.     Pharynx: Oropharynx is clear.  Eyes:     Conjunctiva/sclera: Conjunctivae normal.     Pupils: Pupils are equal, round, and reactive to light.  Cardiovascular:     Rate and Rhythm: Normal rate and regular rhythm.  Pulmonary:     Effort:  Pulmonary effort is normal. No respiratory distress.     Breath sounds: Normal breath sounds.  Abdominal:     General: Bowel sounds are normal.     Palpations: Abdomen is soft.  Neurological:     Mental Status: He is alert and oriented to person, place, and time.     Comments:  Alert and oriented x 4 Notable slurred speech Subjective left-sided decreased sensation but sensation is grossly intact Subtle left-sided facial droop 5 out of 5 strength throughout Normal finger-to-nose bilaterally  Psychiatric:        Mood and Affect: Mood normal.        Behavior: Behavior normal.    Data Reviewed: CBC with WBC 7.0, hemoglobin 15.1, platelets of 236 CMP with sodium of 140, potassium 4.1, bicarb 25, glucose 120, BUN 13, creatinine 1.04, alkaline phosphatase 131, AST 29, ALT 21 and GFR above 60 INR 2.0 PTT 41 Alcohol level negative  EKG personally reviewed.  Sinus rhythm with rate of 97.  No ST or T wave changes concerning for acute ischemia.  CT ANGIO HEAD NECK W WO CM (CODE STROKE)  Result Date: 08/27/2022 CLINICAL DATA:  Neuro deficit, acute, stroke suspected aphasia, dysarthria EXAM: CT ANGIOGRAPHY HEAD AND NECK WITH AND WITHOUT CONTRAST TECHNIQUE: Multidetector CT imaging of the head and neck was performed using the standard protocol during bolus administration of intravenous contrast. Multiplanar CT image reconstructions and MIPs were obtained to evaluate the vascular anatomy. Carotid stenosis measurements (when applicable) are obtained utilizing NASCET criteria, using the distal internal carotid diameter as the denominator. RADIATION DOSE REDUCTION: This exam was performed according to the departmental dose-optimization program which includes automated exposure control, adjustment of the mA and/or kV according to patient size and/or use of iterative reconstruction technique. CONTRAST:  75mL OMNIPAQUE IOHEXOL 350 MG/ML SOLN COMPARISON:  Same day CT head FINDINGS: CT HEAD FINDINGS See same  day CT head for intracranial findings CTA NECK FINDINGS Aortic arch: Standard branching. Imaged portion shows no evidence of aneurysm or dissection. No significant stenosis of the major arch vessel origins. Right carotid system: No evidence of dissection, stenosis (50% or greater), or occlusion. Left carotid system: No evidence of dissection, stenosis (50% or greater), or occlusion. Vertebral arteries: Codominant. No evidence of dissection, stenosis (50% or greater), or occlusion. Skeleton: Negative. Other neck: Negative. Upper chest: Nonspecific prominent mediastinal lymph nodes, likely reactive. Review of the MIP images confirms the above findings CTA HEAD FINDINGS Anterior circulation: No significant stenosis, proximal occlusion, aneurysm, or vascular malformation. Posterior circulation: No significant stenosis, proximal occlusion, aneurysm, or vascular malformation. 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 Anatomic variants: Fetal PCA on the right. Review of the MIP images confirms the above findings IMPRESSION: 1. No intracranial  large vessel occlusion. 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 2. No hemodynamically significant stenosis in the neck. Electronically Signed   By: Lorenza Cambridge M.D.   On: 08/27/2022 15:13   CT HEAD CODE STROKE WO CONTRAST  Result Date: 08/27/2022 CLINICAL DATA:  Code stroke.  Slurred speech EXAM: CT HEAD WITHOUT CONTRAST TECHNIQUE: Contiguous axial images were obtained from the base of the skull through the vertex without intravenous contrast. RADIATION DOSE REDUCTION: This exam was performed according to the departmental dose-optimization program which includes automated exposure control, adjustment of the mA and/or kV according to patient size and/or use of iterative reconstruction technique. COMPARISON:  None Available. FINDINGS: Brain: No evidence of acute infarction, hemorrhage, hydrocephalus,  extra-axial collection or mass lesion/mass effect. Vascular: Possible hyperdense left MCA Skull: Normal. Negative for fracture or focal lesion. Sinuses/Orbits: Right-sided mastoid and middle ear effusion. There is also fluid in the right petrous apex. Paranasal sinuses are clear. Orbits are unremarkable. There may be dehiscence of the tegmen mastoideum on the right. Other: None. ASPECTS [facility] Stroke Program Early CT Score): 10 when accounting for chronic findings. IMPRESSION: 1. Possible hyperdense left MCA. Recommend further evaluation with a CTA of the head and neck. 2. No hemorrhage or CT evidence of an acute cortical infarct. 3. Right-sided mastoid and middle ear effusion. There is also opacification of the petrous apex air cells on the right. Findings were paged to Dr. Selina Cooley on 08/27/22 at 2:59 PM via [facility] paging system. Electronically Signed   By: Lorenza Cambridge M.D.   On: 08/27/2022 15:06    Results are pending, will review when available.  Assessment and Plan:  * Acute CVA (cerebrovascular accident) [facility]) Patient is presenting with sudden onset aphasia and left-sided facial droop with evidence of possible hyperdensity seen on the right MCA.  Undetermined etiology given INR has historically been within goal, although his goal being 2-3 is a bit unusual, as I would expect it to be 2.5-3.5 with a mechanical valve. Significant slurred speech has persisted.   - Neurology consulted; appreciate their recommendations - MRI unable to be completed due to patient's severe claustrophobia. Repeat CT heading tomorrow.  - Telemetry monitoring - Allow for permissive HTN (systolic < 220 and diastolic < 120) - Echocardiogram  - A1C and Lipid panel  - ASA per pharmacy - Start statin  - PT/OT/SLP  Long term current use of anticoagulant therapy Patient has a history of mechanical aortic valve replacement due to bicuspid valve, replaced in 2005.  INR goal per cardiology notes is 2-3.  - Warfarin per  pharmacy dosing  Hypertension, essential - Hold home amlodipine in the setting of permissive hypertension  Advance Care Planning:   Code Status: Full Code   Consults: Neurology  Family Communication: Patient's wife updated at bedside  Severity of Illness: The appropriate patient status for this patient is OBSERVATION. Observation status is judged to be reasonable and necessary in order to provide the required intensity of service to ensure the patient's safety. The patient's presenting symptoms, physical exam findings, and initial radiographic and laboratory data in the context of their medical condition is felt to place them at decreased risk for further clinical deterioration. Furthermore, it is anticipated that the patient will be medically stable for discharge from the hospital within 2 midnights of admission.   Author: Verdene Lennert, MD 08/27/2022 4:31 PM  For on call review [email or web address].

## 2022-08-27 NOTE — Assessment & Plan Note (Signed)
Patient has a history of mechanical aortic valve replacement due to bicuspid valve, replaced in 2005.  INR goal per cardiology notes is 2-3.  - Warfarin per pharmacy dosing

## 2022-08-27 NOTE — Assessment & Plan Note (Addendum)
Patient is presenting with sudden onset aphasia and left-sided facial droop with evidence of possible hyperdensity seen on the right MCA.  Undetermined etiology given INR has historically been within goal, although his goal being 2-3 is a bit unusual, as I would expect it to be 2.5-3.5 with a mechanical valve. Significant slurred speech has persisted.   - Neurology consulted; appreciate their recommendations - MRI unable to be completed due to patient's severe claustrophobia. Repeat CT heading tomorrow.  - Telemetry monitoring - Allow for permissive HTN (systolic < 220 and diastolic < 120) - Echocardiogram  - A1C and Lipid panel  - ASA per pharmacy - Start statin  - PT/OT/SLP

## 2022-08-27 NOTE — Progress Notes (Signed)
ANTICOAGULATION CONSULT NOTE - Initial Consult  Pharmacy Consult for warfarin Indication:  mechanical aortic valve replacement  No Known Allergies  Patient Measurements: Height: 6\' 1"  (185.4 cm) Weight: 90.3 kg (199 lb) IBW/kg (Calculated) : 79.9  Vital Signs: Temp: 98.1 F (36.7 C) (08/04 1432) Temp Source: Oral (08/04 1432) BP: 165/87 (08/04 1432) Pulse Rate: 94 (08/04 1432)  Labs: Recent Labs    08/27/22 1438  HGB 15.1  HCT 43.5  PLT 236  APTT 41*  LABPROT 22.6*  INR 2.0*  CREATININE 1.04    Estimated Creatinine Clearance: 75.8 mL/min (by C-G formula based on SCr of 1.04 mg/dL).   Medical History: History reviewed. No pertinent past medical history.  Assessment: 67 y/o male presented with slurred speech, right-sided facial drop, and aphasia. PMH significant for mechanical aortic valve replacement (11/2003) on warfarin PTA. Code stroke called 8/4 1435 - TNK not given due to INR of 2.   PTA warfarin regimen: 2 mg every MWF, 4 mg all other days INR goal of 2-3, per outpatient cardiology records Last warfarin dose: 4 mg on 08/27/22 in the evening  Date INR   0804 2 therapeutic   Goal of Therapy:  INR 2-3 Monitor platelets by anticoagulation protocol: Yes   Plan:  Give warfarin 4mg  x1 today Monitor daily INR Monitor CBC and signs/symptoms of bleeding  Thank you for involving pharmacy in this patient's care.   Rockwell Alexandria, PharmD Clinical Pharmacist 08/27/2022 4:49 PM

## 2022-08-27 NOTE — ED Notes (Signed)
Code stroke called at 2:30

## 2022-08-27 NOTE — ED Notes (Signed)
Pt starting to have slurred speech and difficulty talking at this time.

## 2022-08-27 NOTE — ED Provider Notes (Signed)
[facility] Provider Note    Event Date/Time   First MD Initiated Contact with Patient 08/27/22 1424     (approximate)   History   Transient Ischemic Attack (/)   HPI  Tim Wright is a 67 y.o. male with a history of aortic valve replacement on Coumadin presents to the ER for evaluation of difficulty speaking that occurred around noon.  Got severe enough to the point where he is unable to form any words for several minutes and then symptoms by the time EMS had arrived was improving.  Still having some word finding difficulty and slurred speech and is not back to his baseline.  Reportedly had some left facial droop as well which reportedly has resolved.  No other associated weakness no trauma.     Physical Exam   Triage Vital Signs: ED Triage Vitals  Encounter Vitals Group     BP      Systolic BP Percentile      Diastolic BP Percentile      Pulse      Resp      Temp      Temp src      SpO2      Weight      Height      Head Circumference      Peak Flow      Pain Score      Pain Loc      Pain Education      Exclude from Growth Chart     Most recent vital signs: Vitals:   08/27/22 1432  BP: (!) 165/87  Pulse: 94  Resp: 19  Temp: 98.1 F (36.7 C)  SpO2: 95%     Constitutional: Alert  Eyes: Conjunctivae are normal.  Head: Atraumatic. Nose: No congestion/rhinnorhea. Mouth/Throat: Mucous membranes are moist.   Neck: Painless ROM.  Cardiovascular:   Good peripheral circulation. Respiratory: Normal respiratory effort.  No retractions.  Gastrointestinal: Soft and nontender.  Musculoskeletal:  no deformity Neurologic:  MAE spontaneously. No gross focal neurologic deficits are appreciated.  Somewhat dysarthric speech on my exam. Skin:  Skin is warm, dry and intact. No rash noted. Psychiatric: Mood and affect are normal. Speech and behavior are normal.    ED Results / Procedures / Treatments   Labs (all labs ordered are listed, but  only abnormal results are displayed) Labs Reviewed  COMPREHENSIVE METABOLIC PANEL - Abnormal; Notable for the following components:      Result Value   Glucose, Bld 120 (*)    Alkaline Phosphatase 131 (*)    All other components within normal limits  CBG MONITORING, ED - Abnormal; Notable for the following components:   Glucose-Capillary 125 (*)    All other components within normal limits  CBC  DIFFERENTIAL  ETHANOL  PROTIME-INR  APTT     EKG  ED ECG REPORT I, Willy Eddy, the attending physician, personally viewed and interpreted this ECG.   Date: 08/27/2022  EKG Time: 14:28  Rate: 95  Rhythm: sinus  Axis: normal  Intervals: borderline prolonged qt  ST&T Change: no stemi, no depressions    RADIOLOGY Please see ED Course for my review and interpretation.  I personally reviewed all radiographic images ordered to evaluate for the above acute complaints and reviewed radiology reports and findings.  These findings were personally discussed with the patient.  Please see medical record for radiology report.    PROCEDURES:  Critical Care performed: No  Procedures   MEDICATIONS  ORDERED IN ED: Medications  iohexol (OMNIPAQUE) 350 MG/ML injection 75 mL (has no administration in time range)     IMPRESSION / MDM / ASSESSMENT AND PLAN / ED COURSE  I reviewed the triage vital signs and the nursing notes.                              Differential diagnosis includes, but is not limited to, cva, tia, hypoglycemia, dehydration, electrolyte abnormality, dissection, sepsis  Patient presenting to the ER for evaluation of symptoms as described above.  Based on symptoms, risk factors and considered above differential, this presenting complaint could reflect a potentially life-threatening illness therefore the patient will be placed on continuous pulse oximetry and telemetry for monitoring.  Laboratory evaluation will be sent to evaluate for the above complaints.  Persistent  symptoms code stroke will be called as he is within the window.  Presentation is complicated as he is status post aortic valve replacement on Coumadin.  Patient is stable for stat neuro eval.  Patient will be signed out to oncoming physician pending follow-up neurorecommendations and imaging.       FINAL CLINICAL IMPRESSION(S) / ED DIAGNOSES   Final diagnoses:  Slurred speech     Rx / DC Orders   ED Discharge Orders     None        Note:  This document was prepared using Dragon voice recognition software and may include unintentional dictation errors.    Willy Eddy, MD 08/27/22 [phone]

## 2022-08-27 NOTE — Progress Notes (Signed)
  Chaplain On-Call responded to Code Stroke notification at 1437 hours.  The patient was at the CT scan area with the medical team.  Chaplain is available for additional support as needed.  Chaplain Morene Crocker., [facility]

## 2022-08-27 NOTE — ED Triage Notes (Signed)
Pt arrived via EMS. Per EMS, pt called his family around 1300 and pt was slurring his words and was not feeling well. When a single responder from EMS arrived pt was aphasic completely and had right side facial drop. When the Ambulance crew arrived pt had returned to normal. Pt sts that he started to feel different around 1200 today.

## 2022-08-27 NOTE — Assessment & Plan Note (Signed)
-   Hold home amlodipine in the setting of permissive hypertension

## 2022-08-28 ENCOUNTER — Encounter: Payer: Self-pay | Admitting: Internal Medicine

## 2022-08-28 ENCOUNTER — Observation Stay: Payer: BC Managed Care – PPO

## 2022-08-28 ENCOUNTER — Observation Stay (HOSPITAL_BASED_OUTPATIENT_CLINIC_OR_DEPARTMENT_OTHER)
Admit: 2022-08-28 | Discharge: 2022-08-28 | Disposition: A | Discharge status: Home / Self Care | Payer: BC Managed Care – PPO | Attending: Internal Medicine | Admitting: Internal Medicine

## 2022-08-28 DIAGNOSIS — I639 Cerebral infarction, unspecified: Secondary | ICD-10-CM | POA: Diagnosis not present

## 2022-08-28 DIAGNOSIS — Z952 Presence of prosthetic heart valve: Secondary | ICD-10-CM | POA: Diagnosis not present

## 2022-08-28 DIAGNOSIS — I6389 Other cerebral infarction: Secondary | ICD-10-CM

## 2022-08-28 DIAGNOSIS — Z7901 Long term (current) use of anticoagulants: Secondary | ICD-10-CM | POA: Diagnosis not present

## 2022-08-28 DIAGNOSIS — I10 Essential (primary) hypertension: Secondary | ICD-10-CM | POA: Diagnosis not present

## 2022-08-28 DIAGNOSIS — R4781 Slurred speech: Secondary | ICD-10-CM | POA: Diagnosis not present

## 2022-08-28 LAB — HIV ANTIBODY (ROUTINE TESTING W REFLEX): HIV Screen 4th Generation wRfx: NONREACTIVE

## 2022-08-28 MED ORDER — ATORVASTATIN CALCIUM 80 MG PO TABS: 80.0000 mg | ORAL_TABLET | Freq: Every day | ORAL | 1 refills | Status: AC

## 2022-08-28 MED ORDER — ATORVASTATIN CALCIUM 20 MG PO TABS: 80.0000 mg | ORAL_TABLET | Freq: Every day | ORAL | Status: DC

## 2022-08-28 MED ORDER — WARFARIN SODIUM 4 MG PO TABS
4.0000 mg | ORAL_TABLET | Freq: Once | ORAL | Status: DC
  Filled 2022-08-28: qty 1

## 2022-08-28 NOTE — Progress Notes (Signed)
*  PRELIMINARY RESULTS* Echocardiogram 2D Echocardiogram has been performed.  Cristela Blue 08/28/2022, 11:51 AM

## 2022-08-28 NOTE — Hospital Course (Addendum)
Taken from H&P.  : Tim Wright is a 67 y.o. 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  He has severe claustrophobia and has required general anesthesia in the past for MRI.   ED course.  Wife notes and labs mostly unremarkable.  INR 2. CT of the head demonstrated possible hyperdense left MCA with no evidence of LVO seen on CTA. Neurology consulted.   8/5: Vital stable.  A1c of 5.7, lipid panel with total cholesterol of 211, triglyceride 155, HDL 37 and LDL 143 with a goal should be less than 70, INR 1.9.  Repeat CT was negative for any acute abnormality Echocardiogram with normal EF, grade 1 diastolic dysfunction and LVH.  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  No acute changes.  Neurology evaluated him.  Patient appears to be at baseline.  No residual deficit.  Patient might have TIA Discussed with patient and wife regarding goal INR as with mechanical [name] is normally between 2.5-3.5.  Per wife his cardiologist set the goal at 2-3.  Patient need to have a close follow-up with his Coumadin clinic for further assistance in maintaining his goal INR.  Neurology did not recommended any aspirin and he will continue with his Coumadin only.  Patient should be taking his Crestor very regularly and goal LDL is less than 70.  Patient will continue on current management and need to have a close follow-up with his providers for further recommendations.

## 2022-08-28 NOTE — Plan of Care (Signed)
  Problem: Education: Goal: Knowledge of disease or condition will improve Outcome: Progressing Goal: Knowledge of secondary prevention will improve (MUST DOCUMENT ALL) Outcome: Progressing Goal: Knowledge of patient specific risk factors will improve Loraine Leriche N/A or DELETE if not current risk factor) Outcome: Progressing   Problem: Ischemic Stroke/TIA Tissue Perfusion: Goal: Complications of ischemic stroke/TIA will be minimized Outcome: Progressing   Problem: Coping: Goal: Will verbalize positive feelings about self Outcome: Progressing   Problem: Health Behavior/Discharge Planning: Goal: Ability to manage health-related needs will improve Outcome: Progressing Goal: Goals will be collaboratively established with patient/family Outcome: Progressing   Problem: Self-Care: Goal: Ability to participate in self-care as condition permits will improve Outcome: Progressing   Problem: Nutrition: Goal: Risk of aspiration will decrease Outcome: Progressing   Problem: Health Behavior/Discharge Planning: Goal: Ability to manage health-related needs will improve Outcome: Progressing   Problem: Clinical Measurements: Goal: Ability to maintain clinical measurements within normal limits will improve Outcome: Progressing Goal: Cardiovascular complication will be avoided Outcome: Progressing   Problem: Activity: Goal: Risk for activity intolerance will decrease Outcome: Progressing   Problem: Nutrition: Goal: Adequate nutrition will be maintained Outcome: Progressing   Problem: Coping: Goal: Level of anxiety will decrease Outcome: Progressing   Problem: Pain Managment: Goal: General experience of comfort will improve Outcome: Progressing   Problem: Safety: Goal: Ability to remain free from injury will improve Outcome: Progressing   Problem: Skin Integrity: Goal: Risk for impaired skin integrity will decrease Outcome: Progressing

## 2022-08-28 NOTE — Plan of Care (Addendum)
Patient is alert and oriented x4 CT-scan was completed. PIV removed Patient discharge home with self care.  Problem: Education: Goal: Knowledge of disease or condition will improve Outcome: Completed/Met Goal: Knowledge of secondary prevention will improve (MUST DOCUMENT ALL) Outcome: Completed/Met Goal: Knowledge of patient specific risk factors will improve Loraine Leriche N/A or DELETE if not current risk factor) Outcome: Completed/Met   Problem: Ischemic Stroke/TIA Tissue Perfusion: Goal: Complications of ischemic stroke/TIA will be minimized Outcome: Completed/Met   Problem: Coping: Goal: Will verbalize positive feelings about self Outcome: Completed/Met Goal: Will identify appropriate support needs Outcome: Completed/Met   Problem: Health Behavior/Discharge Planning: Goal: Ability to manage health-related needs will improve Outcome: Completed/Met Goal: Goals will be collaboratively established with patient/family Outcome: Completed/Met   Problem: Self-Care: Goal: Ability to participate in self-care as condition permits will improve Outcome: Completed/Met Goal: Verbalization of feelings and concerns over difficulty with self-care will improve Outcome: Completed/Met Goal: Ability to communicate needs accurately will improve Outcome: Completed/Met   Problem: Nutrition: Goal: Risk of aspiration will decrease Outcome: Completed/Met Goal: Dietary intake will improve Outcome: Completed/Met   Problem: Education: Goal: Knowledge of General Education information will improve Description: Including pain rating scale, medication(s)/side effects and non-pharmacologic comfort measures Outcome: Completed/Met   Problem: Health Behavior/Discharge Planning: Goal: Ability to manage health-related needs will improve Outcome: Completed/Met   Problem: Clinical Measurements: Goal: Ability to maintain clinical measurements within normal limits will improve Outcome: Completed/Met Goal: Will  remain free from infection Outcome: Completed/Met Goal: Diagnostic test results will improve Outcome: Completed/Met Goal: Respiratory complications will improve Outcome: Completed/Met Goal: Cardiovascular complication will be avoided Outcome: Completed/Met   Problem: Activity: Goal: Risk for activity intolerance will decrease Outcome: Completed/Met   Problem: Nutrition: Goal: Adequate nutrition will be maintained Outcome: Completed/Met   Problem: Coping: Goal: Level of anxiety will decrease Outcome: Completed/Met   Problem: Elimination: Goal: Will not experience complications related to bowel motility Outcome: Completed/Met Goal: Will not experience complications related to urinary retention Outcome: Completed/Met   Problem: Pain Managment: Goal: General experience of comfort will improve Outcome: Completed/Met   Problem: Safety: Goal: Ability to remain free from injury will improve Outcome: Completed/Met   Problem: Skin Integrity: Goal: Risk for impaired skin integrity will decrease Outcome: Completed/Met

## 2022-08-28 NOTE — Progress Notes (Signed)
Patient arrived to floor on stretcher by Nurse, alert and orientated. No respiratory distress noted. Ambulated to bathroom without difficulty. IV bilateral AC intact, orientated to room and call bell. Stroke book given and explain to pt and family.

## 2022-08-28 NOTE — Progress Notes (Signed)
SLP Cancellation Note  Patient Details Name: Tim Wright MRN: [id number] DOB: 11/27/1955   Cancelled treatment:       Reason Eval/Treat Not Completed: SLP screened, no needs identified, will sign off (chart reviewed; consulted Team then met w/ pt in room)  Pt denied any difficulty swallowing and is currently on a regular diet; tolerates swallowing pills w/ water per NSG. Pt was feeding self breakfast meal during visit w/ no difficulties noted.  Pt conversed in full conversation w/out overt expressive/receptive deficits noted; pt denied any current speech-language deficits. Speech clear, intelligible. No further skilled ST services indicated as pt appears at his baseline. Pt agreed. NSG to reconsult if any change in status while admitted.      Jerilynn Som, MS, CCC-SLP Speech Language Pathologist Rehab Services; [facility] [phone] (ascom) [name] 08/28/2022, 9:49 AM

## 2022-08-28 NOTE — Progress Notes (Signed)
PT Cancellation Note  Patient Details Name: Tim Wright MRN: [id number] DOB: 01-23-1956   Cancelled Treatment:    Reason Eval/Treat Not Completed: Other (comment). Per OT, pt is indep with no needs at this time. Will sign off. Please re-order if needs change.   Shakeeta Godette 08/28/2022, 9:33 AM Elizabeth Palau, PT, DPT, GCS [phone]

## 2022-08-28 NOTE — Evaluation (Signed)
Occupational Therapy Evaluation Patient Details Name: Tim Wright MRN: [id number] DOB: Oct 27, 1955 Today's Date: 08/28/2022   History of Present Illness 67 yo man with hx mechanical aortic valve on warfarin with INR goal 2-3, HTN, HL, ascending aortic aneurysm without rupture, who presents with acute onset dysarthria and aphasia.   Clinical Impression   Pt seen for OT evaluation this date. 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 Pt lives with his spouse in a 1 story home with 9 steps to enter with bilateral hand rails. Currently pt reporting symptoms have resolved. Pt demonstrates baseline independence to perform ADL and mobility tasks in room and hallway with no strength, sensory, coordination, cognitive, or visual deficits appreciated with assessment. Pt noting all speech deficits have resolved. No skilled OT needs identified. Will sign off. Please re-consult if additional OT needs arise.    Recommendations for follow up therapy are one component of a multi-disciplinary discharge planning process, led by the attending physician.  Recommendations may be updated based on patient status, additional functional criteria and insurance authorization.   Assistance Recommended at Discharge None  Patient can return home with the following      Functional Status Assessment  Patient has not had a recent decline in their functional status  Equipment Recommendations  None recommended by OT    Recommendations for Other Services       Precautions / Restrictions        Mobility Bed Mobility Overal bed mobility: Independent                  Transfers Overall transfer level: Independent                        Balance Overall balance assessment: Independent                                         ADL either performed or assessed with clinical judgement   ADL  Overall ADL's : Independent;At baseline                                             Vision         Perception     Praxis      Pertinent Vitals/Pain Pain Assessment Pain Assessment: No/denies pain     Hand Dominance     Extremity/Trunk Assessment Upper Extremity Assessment Upper Extremity Assessment: Overall WFL for tasks assessed   Lower Extremity Assessment Lower Extremity Assessment: Overall WFL for tasks assessed   Cervical / Trunk Assessment Cervical / Trunk Assessment: Normal   Communication Communication Communication: No difficulties   Cognition Arousal/Alertness: Awake/alert Behavior During Therapy: WFL for tasks assessed/performed Overall Cognitive Status: Within Functional Limits for tasks assessed                                       General Comments       Exercises     Shoulder Instructions      Home Living Family/patient expects to be discharged to:: Private residence Living Arrangements: Spouse/significant other Available Help at Discharge: Family;Available  24 hours/day Type of Home: House Home Access: Stairs to enter [employer] of Steps: 9 Entrance Stairs-Rails: Right;Left;Can reach both Home Layout: One level     Bathroom Shower/Tub: Producer, television/film/video: Handicapped height                Prior Functioning/Environment Prior Level of Function : Independent/Modified Independent;Working/employed;Driving             Mobility Comments: independent ADLs Comments: 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        OT Problem List: Other (comment) (initial deficits in speech)      OT Treatment/Interventions:      OT Goals(Current goals can be found in the care plan section) Acute Rehab OT Goals Patient Stated Goal: go home OT Goal Formulation: All assessment and education complete, DC therapy  OT Frequency:      Co-evaluation              AM-PAC  OT "6 Clicks" Daily Activity     Outcome Measure Help from another person eating meals?: None Help from another person taking care of personal grooming?: None Help from another person toileting, which includes using toliet, bedpan, or urinal?: None Help from another person bathing (including washing, rinsing, drying)?: None Help from another person to put on and taking off regular upper body clothing?: None Help from another person to put on and taking off regular lower body clothing?: None 6 Click Score: 24   End of Session    Activity Tolerance: Patient tolerated treatment well Patient left: in chair;with call bell/phone within reach  OT Visit Diagnosis: Cognitive communication deficit (R41.841) Symptoms and signs involving cognitive functions: Cerebral infarction                Time: 5176-1607 OT Time Calculation (min): 22 min Charges:  OT General Charges $OT Visit: 1 Visit OT Evaluation $OT Eval Low Complexity: 1 Low  Arman Filter., MPH, MS, OTR/L ascom [phone] 08/28/22, 9:17 AM

## 2022-08-28 NOTE — Discharge Summary (Signed)
Physician Discharge Summary   Patient: Tim Wright MRN: [id number] DOB: Jul 29, 1955  Admit date:     08/27/2022  Discharge date: 08/28/22  Discharge Physician: Arnetha Courser   PCP: Lupita Dawn, MD   Recommendations at discharge:  Please obtain INR within next couple of days. Please evaluate for goal INR as with mechanical valve, normal goal is between 2.5-3.5. Follow-up with primary care provider  Discharge Diagnoses: Principal Problem:   Acute CVA (cerebrovascular accident) [facility]) Active Problems:   Long term current use of anticoagulant therapy   Hypertension, essential   Slurred speech   Hospital Course: Taken from H&P.  : Tim Wright is a 67 y.o. 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  He has severe claustrophobia and has required general anesthesia in the past for MRI.   ED course.  Wife notes and labs mostly unremarkable.  INR 2. CT of the head demonstrated possible hyperdense left MCA with no evidence of LVO seen on CTA. Neurology consulted.   8/5: Vital stable.  A1c of 5.7, lipid panel with total cholesterol of 211, triglyceride 155, HDL 37 and LDL 143 with a goal should be less than 70, INR 1.9.  Repeat CT was negative for any acute abnormality Echocardiogram with normal EF, grade 1 diastolic dysfunction and LVH.  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  No acute changes.  Neurology evaluated him.  Patient appears to be at baseline.  No residual deficit.  Patient might have TIA Discussed with patient and wife regarding goal INR as with mechanical Tim Wright is normally between 2.5-3.5.  Per wife his cardiologist set the goal at 2-3.  Patient need to have a close follow-up with his Coumadin  clinic for further assistance in maintaining his goal INR.  Neurology did not recommended any aspirin and he will continue with his Coumadin only.  Patient should be taking his Crestor very regularly and goal LDL is less than 70.  Patient will continue on current management and need to have a close follow-up with his providers for further recommendations.    Assessment and Plan: * Acute CVA (cerebrovascular accident) [facility]) Patient is presenting with sudden onset aphasia and left-sided facial droop with evidence of possible hyperdensity seen on the right MCA.  Undetermined etiology given INR has historically been within goal, although his goal being 2-3 is a bit unusual, as I would expect it to be 2.5-3.5 with a mechanical valve.  Repeat CT head was again negative for acute abnormality.  Unable to obtain MRI due to severe claustrophobia. Lipid panel with significantly elevated LDL at 143, goal should be less than 70.  His home Crestor dose was increased to 80 mg and he need to have a close follow-up with his providers for further recommendations. All symptoms has been resolved and no residual deficit.  Long term current use of anticoagulant therapy Patient has a history of mechanical aortic valve replacement due to bicuspid valve, replaced in 2005.  INR goal per cardiology notes is 2-3.  - Warfarin per pharmacy dosing  Hypertension, essential Patient will continue home medications   Consultants: Neurology Procedures performed: None Disposition: Home Diet recommendation:  Discharge Diet Orders (From admission, onward)     Start     Ordered   08/28/22 0000  Diet -  low sodium heart healthy        08/28/22 1316           Cardiac diet DISCHARGE MEDICATION: Allergies as of 08/28/2022   No Known Allergies      Medication List     TAKE these medications    amLODipine 5 MG tablet Commonly known as: NORVASC Take 1 tablet by mouth every evening.   ascorbic acid 500 MG  tablet Commonly known as: VITAMIN C Take 500 mg by mouth daily.   atorvastatin 80 MG tablet Commonly known as: LIPITOR Take 1 tablet (80 mg total) by mouth daily. Start taking on: August 29, 2022   cyanocobalamin 1000 MCG tablet Commonly known as: VITAMIN B12 Take 1,000 mcg by mouth daily.   HYDROcodone-acetaminophen 5-325 MG tablet Commonly known as: NORCO/VICODIN Take 2 tablets by mouth every 4 (four) hours as needed for moderate pain. What changed:  how much to take when to take this   multivitamin with minerals Tabs tablet Take 1 tablet by mouth daily.   warfarin 2 MG tablet Commonly known as: COUMADIN Take 2-4 mg by mouth See admin instructions. Take 1 tablet (2mg ) by mouth every Monday, Wednesday and Friday evening and take 2 tablets (4mg ) by mouth every Tuesday, Thursday, Saturday and Sunday evening   zinc sulfate 220 (50 Zn) MG capsule Take 220 mg by mouth daily.        Follow-up Information     Corey, John, MD. Schedule an appointment as soon as possible for a visit in 1 week(s).   Specialty: Family Medicine Contact information: [address] [place] [address] [phone]                Discharge Exam: Filed Weights   08/27/22 1433  Weight: 90.3 kg   General.  Well-developed gentleman.  In no acute distress. Pulmonary.  Lungs clear bilaterally, normal respiratory effort. CV.  Regular rate and rhythm, no JVD, rub or murmur. Abdomen.  Soft, nontender, nondistended, BS positive. CNS.  Alert and oriented .  No focal neurologic deficit. Extremities.  No edema, no cyanosis, pulses intact and symmetrical. Psychiatry.  Judgment and insight appears normal.   Condition at discharge: stable  The results of significant diagnostics from this hospitalization (including imaging, microbiology, ancillary and laboratory) are listed below for reference.   Imaging Studies: ECHOCARDIOGRAM COMPLETE  Result Date: 08/28/2022    ECHOCARDIOGRAM  REPORT   Patient Name:   [name] Date of Exam: 08/28/2022 Medical Rec #:  [id number]     Height:       73.0 in Accession #:    [id number]    Weight:       199.0 lb Date of Birth:  10/14/1955     BSA:          2.147 m Patient Age:    67 years      BP:           126/79 mmHg Patient Gender: M             HR:           80  bpm. Exam Location:  [facility] Procedure: 2D Echo, Cardiac Doppler and Color Doppler Indications:     Stroke I63.9                  S/P Aortic valve replacement Z95.2  History:         Patient has no prior history of Echocardiogram examinations. No  past medical history on file.                  Aortic Valve: mechanical valve is present in the aortic                  position.  Sonographer:     Cristela Blue Referring Phys:  [id number] Verdene Lennert Diagnosing Phys: Yvonne Kendall MD IMPRESSIONS  1. Left ventricular ejection fraction, by estimation, is 55 to 60%. The left ventricle has normal function. Left ventricular endocardial border not optimally defined to evaluate regional wall motion. There is severe left ventricular hypertrophy. Left ventricular diastolic parameters are consistent with Grade I diastolic dysfunction (impaired relaxation).  2. Right ventricular systolic function is mildly reduced. The right ventricular size is normal. Tricuspid regurgitation signal is inadequate for assessing PA pressure.  3. The mitral valve is normal in structure. Trivial mitral valve regurgitation. No evidence of mitral stenosis.  4. The aortic valve has been repaired/replaced. Aortic valve regurgitation is mild. There is a mechanical valve present in the aortic position. Aortic valve area, by VTI measures 2.29 cm. Aortic valve mean gradient measures 8.0 mmHg.  5. Aortic root/ascending aorta has been repaired/replaced. 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 Conclusion(s)/Recommendation(s): Recommended CTA chest to better assess dilated aortic root s/p remote mechanic aortic  valve replacement and root repair. FINDINGS  Left Ventricle: Left ventricular ejection fraction, by estimation, is 55 to 60%. The left ventricle has normal function. Left ventricular endocardial border not optimally defined to evaluate regional wall motion. The left ventricular internal cavity size was normal in size. There is severe left ventricular hypertrophy. Left ventricular diastolic parameters are consistent with Grade I diastolic dysfunction (impaired relaxation). Right Ventricle: The right ventricular size is normal. No increase in right ventricular wall thickness. Right ventricular systolic function is mildly reduced. Tricuspid regurgitation signal is inadequate for assessing PA pressure. Left Atrium: Left atrial size was normal in size. Right Atrium: Right atrial size was normal in size. Pericardium: The pericardium was not well visualized. Mitral Valve: The mitral valve is normal in structure. Mild mitral annular calcification. Trivial mitral valve regurgitation. No evidence of mitral valve stenosis. MV peak gradient, 4.8 mmHg. The mean mitral valve gradient is 2.0 mmHg. Tricuspid Valve: The tricuspid valve is not well visualized. Tricuspid valve regurgitation is trivial. Aortic Valve: The aortic valve has been repaired/replaced. Aortic valve regurgitation is mild. Aortic valve mean gradient measures 8.0 mmHg. Aortic valve peak gradient measures 15.3 mmHg. Aortic valve area, by VTI measures 2.29 cm. There is a mechanical  valve present in the aortic position. Pulmonic Valve: The pulmonic valve was normal in structure. Pulmonic valve regurgitation is not visualized. No evidence of pulmonic stenosis. Aorta: The aortic root/ascending aorta has been repaired/replaced. 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 Pulmonary Artery: The pulmonary artery is not well seen. IAS/Shunts: The interatrial septum was not well visualized.  LEFT VENTRICLE PLAX 2D LVIDd:         4.60 cm   Diastology  LVIDs:         3.30 cm   LV e' medial:    4.35 cm/s LV PW:         1.50 cm   LV E/e' medial:  14.3 LV IVS:        1.81 cm   LV e' lateral:   14.40 cm/s LVOT diam:     2.30 cm   LV E/e' lateral: 4.3  LV SV:         68 LV SV Index:   32 LVOT Area:     4.15 cm  RIGHT VENTRICLE RV Basal diam:  3.40 cm RV Mid diam:    2.70 cm RV S prime:     12.60 cm/s TAPSE (M-mode): 1.6 cm LEFT ATRIUM             Index        RIGHT ATRIUM           Index LA diam:        4.60 cm 2.14 cm/m   RA Area:     17.90 cm LA Vol (A2C):   35.0 ml 16.30 ml/m  RA Volume:   45.20 ml  21.05 ml/m LA Vol (A4C):   32.6 ml 15.18 ml/m LA Biplane Vol: 37.2 ml 17.33 ml/m  AORTIC VALVE AV Area (Vmax):    1.94 cm AV Area (Vmean):   2.11 cm AV Area (VTI):     2.29 cm AV Vmax:           195.33 cm/s AV Vmean:          122.000 cm/s AV VTI:            0.298 m AV Peak Grad:      15.3 mmHg AV Mean Grad:      8.0 mmHg LVOT Vmax:         91.00 cm/s LVOT Vmean:        61.900 cm/s LVOT VTI:          0.164 m LVOT/AV VTI ratio: 0.55  AORTA Ao Root diam: 5.23 cm MITRAL VALVE MV Area (PHT): 1.99 cm    SHUNTS MV Area VTI:   2.54 cm    Systemic VTI:  0.16 m MV Peak grad:  4.8 mmHg    Systemic Diam: 2.30 cm MV Mean grad:  2.0 mmHg MV Vmax:       1.09 m/s MV Vmean:      68.9 cm/s MV Decel Time: 382 msec MV E velocity: 62.40 cm/s MV A velocity: 90.70 cm/s MV E/A ratio:  0.69 Christopher End MD Electronically signed by Yvonne Kendall MD Signature Date/Time: 08/28/2022/12:41:02 PM    Final    CT HEAD WO CONTRAST ( )  Result Date: 08/28/2022 CLINICAL DATA:  Aphasia EXAM: CT HEAD WITHOUT CONTRAST TECHNIQUE: Contiguous axial images were obtained from the base of the skull through the vertex without intravenous contrast. RADIATION DOSE REDUCTION: This exam was performed according to the departmental dose-optimization program which includes automated exposure control, adjustment of the mA and/or kV according to patient size and/or use of iterative reconstruction  technique. COMPARISON:  08/27/2022 FINDINGS: Brain: No acute intracranial findings are seen. There are no signs of bleeding within the cranium. Ventricles are nondilated. Cortical sulci are prominent. Vascular: Scattered arterial calcifications are seen. Skull: No acute findings are seen. Sinuses/Orbits: There are no air-fluid levels in the visualized paranasal sinuses. There is mild mucosal thickening in ethmoid sinus. There is fluid density in right mastoid air cells. Other: No significant interval changes are noted. IMPRESSION: No acute intracranial findings are seen.  Atrophy. Right mastoid effusion has not changed. There is mild mucosal thickening in the ethmoid sinuses suggesting mild chronic sinusitis. Electronically Signed   By: Ernie Avena M.D.   On: 08/28/2022 10:54   CT ANGIO HEAD NECK W WO CM (CODE STROKE)  Result Date: 08/27/2022 CLINICAL DATA:  Neuro deficit, acute, stroke suspected aphasia, dysarthria  EXAM: CT ANGIOGRAPHY HEAD AND NECK WITH AND WITHOUT CONTRAST TECHNIQUE: Multidetector CT imaging of the head and neck was performed using the standard protocol during bolus administration of intravenous contrast. Multiplanar CT image reconstructions and MIPs were obtained to evaluate the vascular anatomy. Carotid stenosis measurements (when applicable) are obtained utilizing NASCET criteria, using the distal internal carotid diameter as the denominator. RADIATION DOSE REDUCTION: This exam was performed according to the departmental dose-optimization program which includes automated exposure control, adjustment of the mA and/or kV according to patient size and/or use of iterative reconstruction technique. CONTRAST:  75mL OMNIPAQUE IOHEXOL 350 MG/ML SOLN COMPARISON:  Same day CT head FINDINGS: CT HEAD FINDINGS See same day CT head for intracranial findings CTA NECK FINDINGS Aortic arch: Standard branching. Imaged portion shows no evidence of aneurysm or dissection. No significant stenosis of the  major arch vessel origins. Right carotid system: No evidence of dissection, stenosis (50% or greater), or occlusion. Left carotid system: No evidence of dissection, stenosis (50% or greater), or occlusion. Vertebral arteries: Codominant. No evidence of dissection, stenosis (50% or greater), or occlusion. Skeleton: Negative. Other neck: Negative. Upper chest: Nonspecific prominent mediastinal lymph nodes, likely reactive. Review of the MIP images confirms the above findings CTA HEAD FINDINGS Anterior circulation: No significant stenosis, proximal occlusion, aneurysm, or vascular malformation. Posterior circulation: No significant stenosis, proximal occlusion, aneurysm, or vascular malformation. 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 Anatomic variants: Fetal PCA on the right. Review of the MIP images confirms the above findings IMPRESSION: 1. No intracranial large vessel occlusion. 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 2. No hemodynamically significant stenosis in the neck. Electronically Signed   By: Lorenza Cambridge M.D.   On: 08/27/2022 15:13   CT HEAD CODE STROKE WO CONTRAST  Result Date: 08/27/2022 CLINICAL DATA:  Code stroke.  Slurred speech EXAM: CT HEAD WITHOUT CONTRAST TECHNIQUE: Contiguous axial images were obtained from the base of the skull through the vertex without intravenous contrast. RADIATION DOSE REDUCTION: This exam was performed according to the departmental dose-optimization program which includes automated exposure control, adjustment of the mA and/or kV according to patient size and/or use of iterative reconstruction technique. COMPARISON:  None Available. FINDINGS: Brain: No evidence of acute infarction, hemorrhage, hydrocephalus, extra-axial collection or mass lesion/mass effect. Vascular: Possible hyperdense left MCA Skull: Normal. Negative for fracture or focal lesion. Sinuses/Orbits: Right-sided mastoid and  middle ear effusion. There is also fluid in the right petrous apex. Paranasal sinuses are clear. Orbits are unremarkable. There may be dehiscence of the tegmen mastoideum on the right. Other: None. ASPECTS [facility] Stroke Program Early CT Score): 10 when accounting for chronic findings. IMPRESSION: 1. Possible hyperdense left MCA. Recommend further evaluation with a CTA of the head and neck. 2. No hemorrhage or CT evidence of an acute cortical infarct. 3. Right-sided mastoid and middle ear effusion. There is also opacification of the petrous apex air cells on the right. Findings were paged to Dr. Selina Cooley on 08/27/22 at 2:59 PM via [facility] paging system. Electronically Signed   By: Lorenza Cambridge M.D.   On: 08/27/2022 15:06    Microbiology: Results for orders placed or performed during the hospital encounter of 01/09/16  Urine culture     Status: None   Collection Time: 01/10/16  3:23 PM   Specimen: Urine, Random  Result Value Ref Range Status   Specimen Description URINE, RANDOM  Final   Special Requests NONE  Final  Culture NO GROWTH Performed at [facility]   Final   Report Status 01/11/2016 FINAL  Final    Labs: CBC: Recent Labs  Lab 08/27/22 1438 08/28/22 0448  WBC 7.0 8.3  NEUTROABS 4.8  --   HGB 15.1 14.1  HCT 43.5 41.8  MCV 94.2 95.7  PLT 236 248   Basic Metabolic Panel: Recent Labs  Lab 08/27/22 1438  NA 140  K 4.1  CL 104  CO2 25  GLUCOSE 120*  BUN 13  CREATININE 1.04  CALCIUM 9.6   Liver Function Tests: Recent Labs  Lab 08/27/22 1438  AST 29  ALT 21  ALKPHOS 131*  BILITOT 0.5  PROT 8.1  ALBUMIN 4.6   CBG: Recent Labs  Lab 08/27/22 1437  GLUCAP 125*    Discharge time spent: greater than 30 minutes.  This record has been created using Conservation officer, historic buildings. Errors have been sought and corrected,but may not always be located. Such creation errors do not reflect on the standard of care.   Signed: Arnetha Courser, MD [facility] 08/28/2022

## 2022-08-28 NOTE — Progress Notes (Signed)
ANTICOAGULATION CONSULT NOTE - Initial Consult  Pharmacy Consult for warfarin Indication:  mechanical aortic valve replacement  No Known Allergies  Patient Measurements: Height: 6\' 1"  (185.4 cm) Weight: 90.3 kg (199 lb) IBW/kg (Calculated) : 79.9  Vital Signs: Temp: 98.5 F (36.9 C) (08/05 0735) Temp Source: Oral (08/05 0735) BP: 126/79 (08/05 0735) Pulse Rate: 80 (08/05 0735)  Labs: Recent Labs    08/27/22 1438 08/28/22 0448  HGB 15.1 14.1  HCT 43.5 41.8  PLT 236 248  APTT 41*  --   LABPROT 22.6* 21.8*  INR 2.0* 1.9*  CREATININE 1.04  --     Estimated Creatinine Clearance: 75.8 mL/min (by C-G formula based on SCr of 1.04 mg/dL).   Medical History: History reviewed. No pertinent past medical history.  Assessment: 67 y/o male presented with slurred speech, right-sided facial drop, and aphasia. PMH significant for mechanical aortic valve replacement (11/2003) on warfarin PTA. Code stroke called 8/4 1435 - TNK not given due to INR of 2.   PTA warfarin regimen: 2 mg every MWF, 4 mg all other days INR goal of 2-3, per outpatient cardiology records Last warfarin dose: 4 mg on 08/27/22 in the evening   Date INR Warfarin Dose  8/4 2.0 Warfarin 4 mg  8/4 1.9 Warfarin 4 mg     Goal of Therapy:  INR 2-3 Monitor platelets by anticoagulation protocol: Yes   Plan: INR 2.0 > 1.9 Give warfarin 4 mg x 1 today Monitor daily INR Monitor CBC and signs/symptoms of bleeding  Thank you for involving pharmacy in this patient's care.    Elliot Gurney, PharmD, BCPS Clinical Pharmacist  08/28/2022 7:42 AM

## 2022-08-28 NOTE — Progress Notes (Addendum)
Neurology Progress Note   S:// Seen and examined.  Reports he is back to baseline in terms of speech   O:// Current vital signs: BP 126/79 (BP Location: Left Arm)   Pulse 80   Temp 98.5 F (36.9 C) (Oral)   Resp 14   Ht 6\' 1"  (1.854 m)   Wt 90.3 kg   SpO2 (!) 88%   BMI 26.25 kg/m  Vital signs in last 24 hours: Temp:  [97.6 F (36.4 C)-98.5 F (36.9 C)] 98.5 F (36.9 C) (08/05 0735) Pulse Rate:  [78-97] 80 (08/05 0735) Resp:  [14-28] 14 (08/05 0735) BP: (120-190)/(71-109) 126/79 (08/05 0735) SpO2:  [88 %-98 %] 88 % (08/05 0735) Weight:  [90.3 kg] 90.3 kg (08/04 1433) General: Awake alert in no distress HEENT: Normocephalic atraumatic Lungs: Clear Cardiovascular: RRR Neurological exam Awake alert oriented x 3 No evidence of dysarthria.  No evidence of aphasia. Mildly diminished attention concentration Cranial nerves II to XII intact Motor examination with no drift in any of the fours Sensation intact to light touch Coordination examination with no dysmetria NIH stroke scale-0.  Medications  Current Facility-Administered Medications:    acetaminophen (TYLENOL) tablet 650 mg, 650 mg, Oral, Q4H PRN **OR** acetaminophen (TYLENOL) 160 MG/5ML solution 650 mg, 650 mg, Per Tube, Q4H PRN **OR** acetaminophen (TYLENOL) suppository 650 mg, 650 mg, Rectal, Q4H PRN, Verdene Lennert, MD   ascorbic acid (VITAMIN C) tablet 500 mg, 500 mg, Oral, Daily, Verdene Lennert, MD, 500 mg at 08/28/22 7846   atorvastatin (LIPITOR) tablet 40 mg, 40 mg, Oral, Daily, Verdene Lennert, MD, 40 mg at 08/28/22 9629   cyanocobalamin (VITAMIN B12) tablet 1,000 mcg, 1,000 mcg, Oral, Daily, Verdene Lennert, MD, 1,000 mcg at 08/28/22 5284   multivitamin with minerals tablet 1 tablet, 1 tablet, Oral, Daily, Verdene Lennert, MD, 1 tablet at 08/28/22 1324   senna-docusate (Senokot-S) tablet 1 tablet, 1 tablet, Oral, QHS PRN, Verdene Lennert, MD   warfarin (COUMADIN) tablet 4 mg, 4 mg, Oral, ONCE-1600, Jaynie Bream, RPH   Warfarin - Pharmacist Dosing Inpatient, , Does not apply, Josue Hector [place], [facility]  Labs CBC    Component Value Date/Time   WBC 8.3 08/28/2022 0448   RBC 4.37 08/28/2022 0448   HGB 14.1 08/28/2022 0448   HCT 41.8 08/28/2022 0448   PLT 248 08/28/2022 0448   MCV 95.7 08/28/2022 0448   MCH 32.3 08/28/2022 0448   MCHC 33.7 08/28/2022 0448   RDW 12.6 08/28/2022 0448   LYMPHSABS 1.1 08/27/2022 1438   MONOABS 0.9 08/27/2022 1438   EOSABS 0.2 08/27/2022 1438   BASOSABS 0.0 08/27/2022 1438    CMP     Component Value Date/Time   NA 140 08/27/2022 1438   K 4.1 08/27/2022 1438   CL 104 08/27/2022 1438   CO2 25 08/27/2022 1438   GLUCOSE 120 (H) 08/27/2022 1438   BUN 13 08/27/2022 1438   CREATININE 1.04 08/27/2022 1438   CALCIUM 9.6 08/27/2022 1438   PROT 8.1 08/27/2022 1438   ALBUMIN 4.6 08/27/2022 1438   AST 29 08/27/2022 1438   ALT 21 08/27/2022 1438   ALKPHOS 131 (H) 08/27/2022 1438   BILITOT 0.5 08/27/2022 1438   GFRNONAA >60 08/27/2022 1438   GFRAA >60 01/12/2016 0744    Lipid Panel     Component Value Date/Time   CHOL 211 (H) 08/27/2022 1630   TRIG 155 (H) 08/27/2022 1630   HDL 37 (L) 08/27/2022 1630   CHOLHDL 5.7 08/27/2022 1630   VLDL  31 08/27/2022 1630   LDLCALC 143 (H) 08/27/2022 1630    Lab Results  Component Value Date   HGBA1C 5.7 (H) 08/27/2022     Imaging I have reviewed images in epic and the results pertinent to this consultation are: CT head: Unremarkable for acute process.  CT angiography head and neck-no emergent LVO.  Moderate stenosis in the proximal right ICA.  2D echo-pending  Assessment:  67 year old man past history of mechanical aortic valve on Coumadin with a goal INR 2-3, hypertension hyperlipidemia ascending aortic aneurysm without rupture presented for acute onset of dysarthria and aphasia yesterday after a prior transient episode of similar type. Seen as an acute code stroke yesterday-NIH 2.  INR 1.9 hence not  a TNK candidate.  CT head and CTA head and neck with no acute process. Extremely claustrophobic-refuses MRI. Pending 24-hour CT. Likely small stroke versus TIA given his risk factors.  Recommendations: 2D echo pending Continue frequent neurochecks Continue telemetry Continue Coumadin for now-will defer to primary team for INR goal.  No indication for antiplatelet from a neurological standpoint. PT OT speech therapy High intensity statin for goal LDL less than 70.  His LDL is 143.  Would recommend atorvastatin 80 Will follow imaging and echo with you once available. Plan relayed to Dr. Nelson Chimes.  -- Milon Dikes, MD Neurologist [facility] Pager: [phone]
